# Patient Record
Sex: Female | Born: 1955 | Race: Black or African American | Hispanic: No | Marital: Single | Smoking: Former smoker
Health system: Southern US, Community
[De-identification: ages and names within clinical notes are randomized; demographics above are authoritative.]

## PROBLEM LIST (undated history)

## (undated) DIAGNOSIS — H269 Unspecified cataract: Secondary | ICD-10-CM

## (undated) DIAGNOSIS — Z860100 Personal history of colon polyps, unspecified: Secondary | ICD-10-CM

## (undated) DIAGNOSIS — M1990 Unspecified osteoarthritis, unspecified site: Secondary | ICD-10-CM

## (undated) DIAGNOSIS — E785 Hyperlipidemia, unspecified: Secondary | ICD-10-CM

## (undated) DIAGNOSIS — Z800 Family history of malignant neoplasm of digestive organs: Secondary | ICD-10-CM

## (undated) DIAGNOSIS — J45909 Unspecified asthma, uncomplicated: Secondary | ICD-10-CM

## (undated) DIAGNOSIS — T7840XA Allergy, unspecified, initial encounter: Secondary | ICD-10-CM

## (undated) DIAGNOSIS — R011 Cardiac murmur, unspecified: Secondary | ICD-10-CM

## (undated) DIAGNOSIS — D649 Anemia, unspecified: Secondary | ICD-10-CM

## (undated) DIAGNOSIS — K5790 Diverticulosis of intestine, part unspecified, without perforation or abscess without bleeding: Secondary | ICD-10-CM

## (undated) HISTORY — PX: UPPER GASTROINTESTINAL ENDOSCOPY: SHX188

## (undated) HISTORY — PX: TONSILLECTOMY: SUR1361

## (undated) HISTORY — DX: Unspecified cataract: H26.9

## (undated) HISTORY — DX: Unspecified asthma, uncomplicated: J45.909

## (undated) HISTORY — DX: Hyperlipidemia, unspecified: E78.5

## (undated) HISTORY — PX: TOTAL ABDOMINAL HYSTERECTOMY: SHX209

## (undated) HISTORY — DX: Allergy, unspecified, initial encounter: T78.40XA

## (undated) HISTORY — DX: Personal history of colon polyps, unspecified: Z86.0100

## (undated) HISTORY — DX: Cardiac murmur, unspecified: R01.1

## (undated) HISTORY — PX: COLONOSCOPY W/ POLYPECTOMY: SHX1380

## (undated) HISTORY — DX: Diverticulosis of intestine, part unspecified, without perforation or abscess without bleeding: K57.90

## (undated) HISTORY — DX: Family history of malignant neoplasm of digestive organs: Z80.0

## (undated) HISTORY — DX: Unspecified osteoarthritis, unspecified site: M19.90

## (undated) HISTORY — DX: Personal history of colonic polyps: Z86.010

## (undated) HISTORY — PX: TOOTH EXTRACTION: SUR596

---

## 2002-06-30 DIAGNOSIS — D126 Benign neoplasm of colon, unspecified: Secondary | ICD-10-CM

## 2002-06-30 HISTORY — DX: Benign neoplasm of colon, unspecified: D12.6

## 2007-02-23 ENCOUNTER — Emergency Department (HOSPITAL_COMMUNITY): Admission: EM | Admit: 2007-02-23 | Discharge: 2007-02-23 | Payer: Self-pay | Admitting: Emergency Medicine

## 2019-02-27 DIAGNOSIS — H04123 Dry eye syndrome of bilateral lacrimal glands: Secondary | ICD-10-CM | POA: Diagnosis not present

## 2019-05-20 ENCOUNTER — Encounter: Payer: Self-pay | Admitting: Internal Medicine

## 2019-05-23 ENCOUNTER — Other Ambulatory Visit: Payer: Self-pay | Admitting: Internal Medicine

## 2019-05-23 DIAGNOSIS — Z87891 Personal history of nicotine dependence: Secondary | ICD-10-CM

## 2019-05-23 DIAGNOSIS — Z0000 Encounter for general adult medical examination without abnormal findings: Secondary | ICD-10-CM

## 2019-06-21 ENCOUNTER — Other Ambulatory Visit: Payer: Self-pay

## 2019-06-21 ENCOUNTER — Ambulatory Visit
Admission: RE | Admit: 2019-06-21 | Discharge: 2019-06-21 | Disposition: A | Discharge status: Home / Self Care | Payer: Medicare Other | Source: Ambulatory Visit | Attending: Internal Medicine | Admitting: Internal Medicine

## 2019-06-21 DIAGNOSIS — Z0000 Encounter for general adult medical examination without abnormal findings: Secondary | ICD-10-CM

## 2019-06-21 DIAGNOSIS — Z87891 Personal history of nicotine dependence: Secondary | ICD-10-CM

## 2019-08-26 ENCOUNTER — Encounter: Payer: Self-pay | Admitting: Internal Medicine

## 2019-10-13 ENCOUNTER — Ambulatory Visit (INDEPENDENT_AMBULATORY_CARE_PROVIDER_SITE_OTHER): Payer: Medicare Other | Admitting: Internal Medicine

## 2019-10-13 ENCOUNTER — Encounter: Payer: Self-pay | Admitting: Internal Medicine

## 2019-10-13 VITALS — BP 118/72 | HR 72 | Temp 98.2°F | Ht 62.0 in | Wt 161.5 lb

## 2019-10-13 DIAGNOSIS — Z01818 Encounter for other preprocedural examination: Secondary | ICD-10-CM

## 2019-10-13 DIAGNOSIS — Z860101 Personal history of adenomatous and serrated colon polyps: Secondary | ICD-10-CM | POA: Insufficient documentation

## 2019-10-13 DIAGNOSIS — K5909 Other constipation: Secondary | ICD-10-CM | POA: Diagnosis not present

## 2019-10-13 DIAGNOSIS — Z800 Family history of malignant neoplasm of digestive organs: Secondary | ICD-10-CM | POA: Insufficient documentation

## 2019-10-13 DIAGNOSIS — Z86010 Personal history of colonic polyps: Secondary | ICD-10-CM | POA: Diagnosis not present

## 2019-10-13 MED ORDER — NA SULFATE-K SULFATE-MG SULF 17.5-3.13-1.6 GM/177ML PO SOLN: ORAL | 0 refills | Status: DC

## 2019-10-13 NOTE — Progress Notes (Signed)
   Lori Cummings 64 y.o. 03/08/1956 [id number]  Assessment & Plan:   Encounter Diagnoses  Name Primary?  Marland Kitchen Hx of adenomatous polyp of colon Yes  . Chronic constipation   . Family history of colon cancer- sister at 79     Colonoscopy for hx of adenoma + FHx CRCA Functional rectal exam prior to procedure sedation  Agree w/ daily MiraLax  Further plans pending colonoscopy Consider iron as an aggravating factor though constipation clearly chronic   The risks and benefits as well as alternatives of endoscopic procedure(s) have been discussed and reviewed. All questions answered. The patient agrees to proceed.  [id number], Liane Comber, DO    Subjective:   Chief Complaint: chronic constipation and hx colon polyp  HPI Chronic constipation "forever" Gets urge some but difficult to defecate. No urination difficulty Mg capsule qd helps but "I have to take something"  Ct Mg q week to 2 weeks  Incomplete defecation sensation much of the time  Has tried fiber no help  Dr. Francesco Sor recommended MiraLax qd has not started  Sisiter had CRCA at 29 GI ROS negative o/wise  Hgb 13.6 05/2019 Ca 9.4 TSH NL at 1 Allergies  Allergen Reactions  . Amoxicillin Itching and Rash  . Penicillins Itching and Rash   Current Meds  Medication Sig  . albuterol (VENTOLIN HFA) 108 (90 Base) MCG/ACT inhaler Inhale 2 puffs into the lungs as needed.  . Ascorbic Acid (VITAMIN C PO) Take 1 tablet by mouth daily.  . Cholecalciferol (VITAMIN D3) 125 MCG (5000 UT) CAPS Take 1 capsule by mouth daily.  . Cyanocobalamin (VITAMIN B-12) 2500 MCG SUBL Place 1 tablet under the tongue daily.  Marland Kitchen ELDERBERRY PO Take 1 tablet by mouth daily.  . ferrous sulfate 325 (65 FE) MG EC tablet Take 325 mg by mouth daily.  Marland Kitchen MAGNESIUM PO Take 1 tablet by mouth daily.  . Multiple Vitamins-Minerals (ZINC PO) Take 1 tablet by mouth daily.   Past Medical History:  Diagnosis Date  . Allergy   . Diverticulosis   . HLD  (hyperlipidemia)   . Tubular adenoma of colon 2004   Past Surgical History:  Procedure Laterality Date  . COLONOSCOPY W/ POLYPECTOMY    . TONSILLECTOMY    . TOTAL ABDOMINAL HYSTERECTOMY     Social History   Social History Narrative   Divorced   Part-time hairdresser   Former smoker   occ etOH, no drugs (remote in youth)   family history includes Breast cancer in her maternal aunt; Colon cancer (age of onset: 65) in her sister; Colon polyps in her father; Dementia in her mother and paternal aunt; Heart disease in her brother and mother; Hypertension in her father, mother, and sister; Kidney cancer in her maternal aunt.   Review of Systems As per HPI Some dyspnea, night sweats, allergies, back pain  Objective:   Physical Exam BP 118/72 (BP Location: Left Arm, Patient Position: Sitting, Cuff Size: Normal)   Pulse 72   Temp 98.2 F (36.8 C)   Ht 5\' 2"  (1.575 m) Comment: height measured without shoes  Wt 161 lb 8 oz (73.3 kg)   BMI 29.54 kg/m  NAD Eyes anicteric Lungs cta Cor NL abd soft NT no masses and BS + Rectal deferred to colonoscopy Alert and oriented x 3

## 2019-10-13 NOTE — Patient Instructions (Addendum)
If you are age 66 or older, your body mass index should be between 23-30. Your Body mass index is 29.54 kg/m. If this is out of the aforementioned range listed, please consider follow up with your Primary Care Provider.  If you are age 53 or younger, your body mass index should be between 19-25. Your Body mass index is 29.54 kg/m. If this is out of the aformentioned range listed, please consider follow up with your Primary Care Provider.   You have been scheduled for a colonoscopy. Please follow written instructions given to you at your visit today.  Please pick up your prep supplies at the pharmacy within the next 1-3 days. If you use inhalers (even only as needed), please bring them with you on the day of your procedure. Your physician has requested that you go to www.startemmi.com and enter the access code given to you at your visit today. This web site gives a general overview about your procedure. However, you should still follow specific instructions given to you by our office regarding your preparation for the procedure.  We have sent the following medications to your pharmacy for you to pick up at your convenience: [place].  Start Miralax - 1 capful daily. (This is over-the-counter)  Thank you for choosing me and [facility].   Silvano Rusk, MD

## 2019-11-16 ENCOUNTER — Other Ambulatory Visit: Payer: Self-pay | Admitting: Internal Medicine

## 2019-11-16 ENCOUNTER — Ambulatory Visit (INDEPENDENT_AMBULATORY_CARE_PROVIDER_SITE_OTHER): Payer: Medicare Other

## 2019-11-16 DIAGNOSIS — Z1159 Encounter for screening for other viral diseases: Secondary | ICD-10-CM

## 2019-11-17 ENCOUNTER — Other Ambulatory Visit: Payer: Self-pay | Admitting: Internal Medicine

## 2019-11-17 DIAGNOSIS — Z1231 Encounter for screening mammogram for malignant neoplasm of breast: Secondary | ICD-10-CM

## 2019-11-17 DIAGNOSIS — R5381 Other malaise: Secondary | ICD-10-CM

## 2019-11-17 LAB — SARS CORONAVIRUS 2 (TAT 6-24 HRS): SARS Coronavirus 2: NEGATIVE

## 2019-11-18 ENCOUNTER — Encounter: Payer: Self-pay | Admitting: Internal Medicine

## 2019-11-18 ENCOUNTER — Other Ambulatory Visit: Payer: Self-pay

## 2019-11-18 ENCOUNTER — Ambulatory Visit (AMBULATORY_SURGERY_CENTER): Payer: Medicare Other | Admitting: Internal Medicine

## 2019-11-18 VITALS — BP 122/86 | HR 62 | Temp 97.3°F | Resp 15 | Ht 62.0 in | Wt 161.0 lb

## 2019-11-18 DIAGNOSIS — Z800 Family history of malignant neoplasm of digestive organs: Secondary | ICD-10-CM | POA: Diagnosis not present

## 2019-11-18 DIAGNOSIS — Z86010 Personal history of colonic polyps: Secondary | ICD-10-CM

## 2019-11-18 DIAGNOSIS — D123 Benign neoplasm of transverse colon: Secondary | ICD-10-CM

## 2019-11-18 MED ORDER — SODIUM CHLORIDE 0.9 % IV SOLN: 500.0000 mL | Freq: Once | INTRAVENOUS | Status: DC

## 2019-11-18 NOTE — Progress Notes (Signed)
Vitals-DT  Pt's states no medical or surgical changes since previsit or office visit.  CRNA aware of the patient having 1145 cup of water.

## 2019-11-18 NOTE — Patient Instructions (Addendum)
I found and removed one polyp that looks benign.  You also have a condition called diverticulosis - common and not usually a problem. Please read the handout provided.  The anus and rectum seem to function ok.  Please try taking 2 doses of MiraLax each day and purchase Senna laxative - take 1-2 every other night - see if this does not help your constipation more. If that does not work well enough return to see me.  I appreciate the opportunity to care for you. Gatha Mayer, MD, [facility]  Handouts provided on polyps and diverticulosis.   No aspirin, ibuprofen, naproxen, or other non-steriodal anti-inflammatory drugs for 2 weeks after polyp removal.   YOU HAD AN ENDOSCOPIC PROCEDURE TODAY AT [facility]:   Refer to the procedure report that was given to you for any specific questions about what was found during the examination.  If the procedure report does not answer your questions, please call your gastroenterologist to clarify.  If you requested that your care partner not be given the details of your procedure findings, then the procedure report has been included in a sealed envelope for you to review at your convenience later.  YOU SHOULD EXPECT: Some feelings of bloating in the abdomen. Passage of more gas than usual.  Walking can help get rid of the air that was put into your GI tract during the procedure and reduce the bloating. If you had a lower endoscopy (such as a colonoscopy or flexible sigmoidoscopy) you may notice spotting of blood in your stool or on the toilet paper. If you underwent a bowel prep for your procedure, you may not have a normal bowel movement for a few days.  Please Note:  You might notice some irritation and congestion in your nose or some drainage.  This is from the oxygen used during your procedure.  There is no need for concern and it should clear up in a day or so.  SYMPTOMS TO REPORT IMMEDIATELY:   Following lower endoscopy (colonoscopy or  flexible sigmoidoscopy):  Excessive amounts of blood in the stool  Significant tenderness or worsening of abdominal pains  Swelling of the abdomen that is new, acute  Fever of 100F or higher  For urgent or emergent issues, a gastroenterologist can be reached at any hour by calling [phone]. Do not use MyChart messaging for urgent concerns.    DIET:  We do recommend a small meal at first, but then you may proceed to your regular diet.  Drink plenty of fluids but you should avoid alcoholic beverages for 24 hours.  ACTIVITY:  You should plan to take it easy for the rest of today and you should NOT DRIVE or use heavy machinery until tomorrow (because of the sedation medicines used during the test).    FOLLOW UP: Our staff will call the number listed on your records 48-72 hours following your procedure to check on you and address any questions or concerns that you may have regarding the information given to you following your procedure. If we do not reach you, we will leave a message.  We will attempt to reach you two times.  During this call, we will ask if you have developed any symptoms of COVID 19. If you develop any symptoms (ie: fever, flu-like symptoms, shortness of breath, cough etc.) before then, please call [phone].  If you test positive for Covid 19 in the 2 weeks post procedure, please call and report this information to [place].  If any biopsies were taken you will be contacted by phone or by letter within the next 1-3 weeks.  Please call us at [phone] if you have not heard about the biopsies in 3 weeks.    SIGNATURES/CONFIDENTIALITY: You and/or your care partner have signed paperwork which will be entered into your electronic medical record.  These signatures attest to the fact that that the information above on your After Visit Summary has been reviewed and is understood.  Full responsibility of the confidentiality of this discharge information lies with you and/or  your care-partner.

## 2019-11-18 NOTE — Progress Notes (Signed)
Called to room to assist during endoscopic procedure.  Patient ID and intended procedure confirmed with present staff. Received instructions for my participation in the procedure from the performing physician.  

## 2019-11-18 NOTE — Op Note (Addendum)
[facility] Patient Name: Lori Cummings Procedure Date: 11/18/2019 1:55 PM MRN: [id number] Endoscopist: Gatha Mayer , MD Age: 64 Referring MD:  Date of Birth: 30-Mar-1956 Gender: Female Account #: [id number] Procedure:                Colonoscopy Indications:              Surveillance: Personal history of adenomatous                            polyps on last colonoscopy > 5 years ago Medicines:                Propofol per Anesthesia, Monitored Anesthesia Care Procedure:                Pre-Anesthesia Assessment:                           - Prior to the procedure, a History and Physical                            was performed, and patient medications and                            allergies were reviewed. The patient's tolerance of                            previous anesthesia was also reviewed. The risks                            and benefits of the procedure and the sedation                            options and risks were discussed with the patient.                            All questions were answered, and informed consent                            was obtained. Prior Anticoagulants: The patient has                            taken no previous anticoagulant or antiplatelet                            agents. ASA Grade Assessment: II - A patient with                            mild systemic disease. After reviewing the risks                            and benefits, the patient was deemed in                            satisfactory condition to undergo the procedure.  After obtaining informed consent, the colonoscope                            was passed under direct vision. Throughout the                            procedure, the patient's blood pressure, pulse, and                            oxygen saturations were monitored continuously. The                            Colonoscope was introduced through the anus and   advanced to the the cecum, identified by                            appendiceal orifice and ileocecal valve. The                            colonoscopy was performed without difficulty. The                            patient tolerated the procedure well. The quality                            of the bowel preparation was good. The ileocecal                            valve, appendiceal orifice, and rectum were                            photographed. The bowel preparation used was SUPREP                            via split dose instruction. Scope In: 2:10:32 PM Scope Out: 2:27:20 PM Scope Withdrawal Time: 0 hours 12 minutes 33 seconds  Total Procedure Duration: 0 hours 16 minutes 48 seconds  Findings:                 The perianal and digital rectal examinations were                            normal.                           A 8 mm polyp was found in the transverse colon. The                            polyp was semi-pedunculated. The polyp was removed                            with a hot snare. Resection and retrieval were                            complete. Verification of patient  identification                            for the specimen was done. Estimated blood loss:                            none.                           Multiple small and large-mouthed diverticula were                            found in the sigmoid colon, descending colon,                            transverse colon and ascending colon.                           The exam was otherwise without abnormality on                            direct and retroflexion views. Complications:            No immediate complications. Estimated Blood Loss:     Estimated blood loss: none. Impression:               - One 8 mm polyp in the transverse colon, removed                            with a hot snare. Resected and retrieved.                           - Diverticulosis in the sigmoid colon, in the                             descending colon, in the transverse colon and in                            the ascending colon.                           - The examination was otherwise normal on direct                            and retroflexion views.                           - Personal history of colonic polyps.                           - FHx CRCA sister at 55 Recommendation:           - Patient has a contact number available for                            emergencies. The signs and symptoms of potential  delayed complications were discussed with the                            patient. Return to normal activities tomorrow.                            Written discharge instructions were provided to the                            patient.                           - Resume previous diet.                           - Continue present medications.                           - No aspirin, ibuprofen, naproxen, or other                            non-steroidal anti-inflammatory drugs for 2 weeks                            after polyp removal.                           - Repeat colonoscopy is recommended for                            surveillance. The colonoscopy date will be                            determined after pathology results from today's                            exam become available for review.                           - Try 2 doses MiraLax each day and Senn lax 1-2                            every other night to treat constipation                           - cc Sueanne Margarita, DO Gatha Mayer, MD 11/18/2019 2:38:46 PM This report has been signed electronically.

## 2019-11-18 NOTE — Progress Notes (Signed)
A/ox3, pleased with MAC, report to RN 

## 2019-11-22 ENCOUNTER — Telehealth: Payer: Self-pay | Admitting: *Deleted

## 2019-11-22 NOTE — Telephone Encounter (Signed)
1. Have you developed a fever since your procedure? no  2.   Have you had an respiratory symptoms (SOB or cough) since your procedure? no  3.   Have you tested positive for COVID 19 since your procedure no  4.   Have you had any family members/close contacts diagnosed with the COVID 19 since your procedure?  no   If yes to any of these questions please route to Joylene John, RN and Erenest Rasher, RN Follow up Call-  Call back number 11/18/2019  Post procedure Call Back phone  # [phone]  Permission to leave phone message Yes  Some recent data might be hidden     Patient questions:  Do you have a fever, pain , or abdominal swelling? No. Pain Score  0 *  Have you tolerated food without any problems? Yes.    Have you been able to return to your normal activities? Yes.    Do you have any questions about your discharge instructions: Diet   No. Medications  No. Follow up visit  No.  Do you have questions or concerns about your Care? No.  Actions: * If pain score is 4 or above: No action needed, pain <4.

## 2019-11-24 ENCOUNTER — Encounter: Payer: Self-pay | Admitting: Internal Medicine

## 2019-11-24 ENCOUNTER — Other Ambulatory Visit: Payer: Self-pay | Admitting: Internal Medicine

## 2019-11-24 DIAGNOSIS — E2839 Other primary ovarian failure: Secondary | ICD-10-CM

## 2020-02-03 ENCOUNTER — Ambulatory Visit
Admission: RE | Admit: 2020-02-03 | Discharge: 2020-02-03 | Disposition: A | Discharge status: Home / Self Care | Payer: Medicare Other | Source: Ambulatory Visit | Attending: Internal Medicine | Admitting: Internal Medicine

## 2020-02-03 ENCOUNTER — Other Ambulatory Visit: Payer: Self-pay

## 2020-02-03 DIAGNOSIS — Z1231 Encounter for screening mammogram for malignant neoplasm of breast: Secondary | ICD-10-CM

## 2020-02-03 DIAGNOSIS — E2839 Other primary ovarian failure: Secondary | ICD-10-CM

## 2020-08-10 ENCOUNTER — Other Ambulatory Visit: Payer: Self-pay

## 2020-08-10 ENCOUNTER — Encounter: Payer: Self-pay | Admitting: Podiatry

## 2020-08-10 ENCOUNTER — Ambulatory Visit (INDEPENDENT_AMBULATORY_CARE_PROVIDER_SITE_OTHER): Payer: Medicare Other

## 2020-08-10 ENCOUNTER — Ambulatory Visit (INDEPENDENT_AMBULATORY_CARE_PROVIDER_SITE_OTHER): Payer: Medicare Other | Admitting: Podiatry

## 2020-08-10 DIAGNOSIS — T148XXA Other injury of unspecified body region, initial encounter: Secondary | ICD-10-CM

## 2020-08-10 DIAGNOSIS — M2142 Flat foot [pes planus] (acquired), left foot: Secondary | ICD-10-CM

## 2020-08-10 DIAGNOSIS — M2141 Flat foot [pes planus] (acquired), right foot: Secondary | ICD-10-CM

## 2020-08-10 DIAGNOSIS — M76821 Posterior tibial tendinitis, right leg: Secondary | ICD-10-CM

## 2020-08-10 MED ORDER — TRIAMCINOLONE ACETONIDE 10 MG/ML IJ SUSP
10.0000 mg | Freq: Once | INTRAMUSCULAR | Status: AC
  Administered 2020-08-10: 10 mg

## 2020-08-10 NOTE — Patient Instructions (Signed)

## 2020-08-31 ENCOUNTER — Other Ambulatory Visit: Payer: Self-pay

## 2020-08-31 ENCOUNTER — Encounter: Payer: Self-pay | Admitting: Podiatry

## 2020-08-31 ENCOUNTER — Ambulatory Visit (INDEPENDENT_AMBULATORY_CARE_PROVIDER_SITE_OTHER): Payer: Medicare Other | Admitting: Podiatry

## 2020-08-31 DIAGNOSIS — T148XXA Other injury of unspecified body region, initial encounter: Secondary | ICD-10-CM

## 2020-08-31 DIAGNOSIS — M76822 Posterior tibial tendinitis, left leg: Secondary | ICD-10-CM | POA: Diagnosis not present

## 2020-08-31 DIAGNOSIS — M76821 Posterior tibial tendinitis, right leg: Secondary | ICD-10-CM | POA: Diagnosis not present

## 2020-08-31 NOTE — Progress Notes (Signed)
Subjective:   Patient ID: Lori Cummings, female   DOB: 65 y.o.   MRN: [id number]   HPI Patient states I am improving but has been wearing the boot full-time for the last 3 weeks.  States it is some better but still notes discomfort and knows that she needs long-term help with this   ROS      Objective:  Physical Exam  Neurovascular status intact with posterior tibial dysfunction right with significant reduction of the swelling associated with it but flatfoot deformity     Assessment:  Posterior tibial tendinitis right with possible dysfunction possible tear of the tendon with inflammatory changes and flatfoot     Plan:  H&P reviewed condition at great length.  At this point I did dispense a fascial brace to wear temporarily I casted for orthotics and I want her still to wear the boot for the next several weeks but slowly reduce over that period of time.  Patient will be seen back for [place] to recheck is encouraged to call questions concerns which may arise until orthotics return

## 2020-08-31 NOTE — Progress Notes (Signed)
Subjective:   Patient ID: Lori Cummings, female   DOB: 65 y.o.   MRN: [id number]   HPI Patient presents stating she has severe flatfoot deformity that occurred right and she has had a number of months of pain with walking.  Does not remember specific injury states it is gradually gotten worse over that time.  Patient does not smoke likes to be active   Review of Systems  All other systems reviewed and are negative.       Objective:  Physical Exam Vitals and nursing note reviewed.  Constitutional:      Appearance: She is well-developed and well-nourished.  Cardiovascular:     Pulses: Intact distal pulses.  Pulmonary:     Effort: Pulmonary effort is normal.  Musculoskeletal:        General: Normal range of motion.  Skin:    General: Skin is warm.  Neurological:     Mental Status: She is alert.     Neurovascular status intact muscle strength adequate range of motion adequate.  Patient is found to have significant flatfoot deformity right with inflammation posterior tibial tendon as it comes under the medial malleolus and signs that there may be some abnormal structure or damage to the tendon itself.  Patient is found to have good digital perfusion well oriented x3     Assessment:  Acute posterior tibial tendinitis right     Plan:  H&P reviewed conditions explained condition.  I recommended trying to treat this conservatively with possibility there may be a tear which may have to be addressed long-term.  Today I went ahead and I did a sheath injection 3 mg Dexasone Kenalog 5 mg Xylocaine applied air fracture walker to completely immobilize discussed possible long-term orthotics and MRI if symptoms persist  X-rays indicate significant flatfoot deformity right over left no indication fracture advanced arthritis or subtalar joint coalition

## 2020-09-28 ENCOUNTER — Ambulatory Visit (INDEPENDENT_AMBULATORY_CARE_PROVIDER_SITE_OTHER): Payer: Medicare Other | Admitting: Podiatry

## 2020-09-28 ENCOUNTER — Other Ambulatory Visit: Payer: Self-pay

## 2020-09-28 DIAGNOSIS — M2141 Flat foot [pes planus] (acquired), right foot: Secondary | ICD-10-CM

## 2020-09-28 DIAGNOSIS — M76821 Posterior tibial tendinitis, right leg: Secondary | ICD-10-CM

## 2020-09-28 DIAGNOSIS — M2142 Flat foot [pes planus] (acquired), left foot: Secondary | ICD-10-CM

## 2020-09-28 NOTE — Patient Instructions (Signed)

## 2020-10-02 NOTE — Progress Notes (Signed)
Patient presents 09-28-2020 for orthotic pick up. Patient voices no new complaints.  Orthotics were fitted to patient's feet. No discomfort and no rubbing. Patient satisfied with the orthotics.  Orthotics were dispensed to patient with instructions for break in wear and to call the office with any concerns or questions.

## 2021-01-08 ENCOUNTER — Other Ambulatory Visit: Payer: Self-pay | Admitting: Internal Medicine

## 2021-01-08 DIAGNOSIS — Z1231 Encounter for screening mammogram for malignant neoplasm of breast: Secondary | ICD-10-CM

## 2021-03-01 ENCOUNTER — Ambulatory Visit
Admission: RE | Admit: 2021-03-01 | Discharge: 2021-03-01 | Disposition: A | Discharge status: Home / Self Care | Payer: Medicare Other | Source: Ambulatory Visit | Attending: Internal Medicine | Admitting: Internal Medicine

## 2021-03-01 ENCOUNTER — Other Ambulatory Visit: Payer: Self-pay

## 2021-03-01 DIAGNOSIS — Z1231 Encounter for screening mammogram for malignant neoplasm of breast: Secondary | ICD-10-CM

## 2021-05-30 ENCOUNTER — Ambulatory Visit (INDEPENDENT_AMBULATORY_CARE_PROVIDER_SITE_OTHER): Payer: Medicare Other | Admitting: Internal Medicine

## 2021-05-30 ENCOUNTER — Encounter: Payer: Self-pay | Admitting: Internal Medicine

## 2021-05-30 VITALS — BP 119/64 | HR 78 | Ht 62.0 in | Wt 175.0 lb

## 2021-05-30 DIAGNOSIS — R1012 Left upper quadrant pain: Secondary | ICD-10-CM

## 2021-05-30 DIAGNOSIS — R1013 Epigastric pain: Secondary | ICD-10-CM

## 2021-05-30 NOTE — Patient Instructions (Signed)
You have been scheduled for an endoscopy. Please follow written instructions given to you at your visit today. If you use inhalers (even only as needed), please bring them with you on the day of your procedure.  Please stay on a liquid diet until your procedure.  We will request your lab results from [facility].   I appreciate the opportunity to care for you. Silvano Rusk, MD, [facility]

## 2021-05-30 NOTE — Progress Notes (Addendum)
Lori Cummings 65 y.o. [birth year] [id number]  Assessment & Plan:   Encounter Diagnoses  Name Primary?   Abdominal pain, epigastric Yes   LUQ pain    Needs EGD to evaluate the symptoms.  Dr. Candis Schatz has earlier availability than me so the procedure will be performed on December 5.  The risks and benefits as well as alternatives of endoscopic procedure(s) have been discussed and reviewed. All questions answered. The patient agrees to proceed.   Continue a liquid diet until then  We will request the labs she had drawn earlier this week at primary care  As far as the cause of this question peptic ulcer disease, upper GI tract malignancy is in the differential.  She has been gaining weight however so that goes against.  She reports that she is disturbed by the death of her brother-in-law and nephew so question if this is a manifestation of situational stress.  However that is a diagnosis of exclusion at this point.  Depending upon EGD results, consider cross-sectional imaging with CT scanning.  CC: Sueanne Margarita, DO Dr. Dustin Flock  05/27/2022 labs normal CMP T normal CBC hemoglobin 14 hematocrit 41 platelets 319 white count 4.5 lipase normal at 18 and was normal at 30 Subjective:   Chief Complaint: Left upper quadrant pain and epigastric pain  HPI The patient is a 65 year old woman known to me from prior colonoscopy last done in 2021 with an 8 mm adenoma she also has a family history of colon cancer.  Chronic constipation is treated adequately with MiraLAX.  She reports since Thanksgiving having very severe rapid onset epigastric and left upper quadrant pain after eating.  She is not having dysphagia that I can tell.  After she eats the pain is so intense she induces vomiting.  She has been on a liquid diet and is tolerating that.  She went to primary care on November 28 and was referred here.  I do not have those records.  Laboratory testing was performed and we do not have  those results.  She was started on pantoprazole 40 mg twice daily.  She has restricted her diet still so we do not know if she is able to eat yet again.  She does recall having isolated episodes like this over the last year or so but not as severe.   Brother-in-law and nephew died in past 2 mos - source of stress for her.  No other stressors claimed are identified.  No change in medications.   Wt Readings from Last 3 Encounters:  05/30/21 175 lb (79.4 kg)  11/18/19 161 lb (73 kg)  10/13/19 161 lb 8 oz (73.3 kg)    Allergies  Allergen Reactions   Amoxicillin Itching and Rash   Penicillins Itching and Rash   Current Meds  Medication Sig   albuterol (VENTOLIN HFA) 108 (90 Base) MCG/ACT inhaler Inhale 2 puffs into the lungs as needed.   Ascorbic Acid (VITAMIN C PO) Take 1 tablet by mouth daily.   Cholecalciferol (VITAMIN D3) 125 MCG (5000 UT) CAPS Take 1 capsule by mouth daily.   Cyanocobalamin (VITAMIN B-12) 2500 MCG SUBL Place 1 tablet under the tongue daily.   ELDERBERRY PO Take 1 tablet by mouth daily.   ferrous sulfate 325 (65 FE) MG EC tablet Take 325 mg by mouth daily.   MAGNESIUM PO Take 1 tablet by mouth daily.   Multiple Vitamins-Minerals (ZINC PO) Take 1 tablet by mouth daily.   pantoprazole (PROTONIX) 40 MG tablet  Take 40 mg by mouth 2 (two) times daily before a meal.   Past Medical History:  Diagnosis Date   Allergy    Diverticulosis    Family history of colon cancer    sister   History of colon polyps    HLD (hyperlipidemia)    Past Surgical History:  Procedure Laterality Date   COLONOSCOPY W/ POLYPECTOMY     TONSILLECTOMY     TOTAL ABDOMINAL HYSTERECTOMY     Social History   Social History Narrative   Divorced   Part-time hairdresser   Former smoker   occ etOH, no drugs (remote in youth)   family history includes Breast cancer in her maternal aunt; Colon cancer (age of onset: 96) in her sister; Colon polyps in her father; Dementia in her mother and  paternal aunt; Heart disease in her brother and mother; Hypertension in her father, mother, and sister; Kidney cancer in her maternal aunt.   Review of Systems As per HPI  Objective:   Physical Exam BP 119/64   Pulse 78   Ht 5\' 2"  (1.575 m)   Wt 175 lb (79.4 kg)   SpO2 97%   BMI 32.01 kg/m  Obese bw NAD Anicteric Lungs cta Neck supple, no LA Cor NL S1S2 no rmg Abd obese and soft, ? Sl LUQ tenderness no masses Alert and oriented x 3

## 2021-06-03 ENCOUNTER — Other Ambulatory Visit: Payer: Self-pay

## 2021-06-03 ENCOUNTER — Telehealth: Payer: Self-pay

## 2021-06-03 ENCOUNTER — Encounter: Payer: Self-pay | Admitting: Gastroenterology

## 2021-06-03 ENCOUNTER — Ambulatory Visit (AMBULATORY_SURGERY_CENTER): Payer: Medicare Other | Admitting: Gastroenterology

## 2021-06-03 VITALS — BP 117/78 | HR 69 | Temp 96.8°F | Resp 16 | Ht 62.0 in | Wt 175.0 lb

## 2021-06-03 DIAGNOSIS — R1012 Left upper quadrant pain: Secondary | ICD-10-CM

## 2021-06-03 DIAGNOSIS — R112 Nausea with vomiting, unspecified: Secondary | ICD-10-CM | POA: Diagnosis not present

## 2021-06-03 DIAGNOSIS — K222 Esophageal obstruction: Secondary | ICD-10-CM | POA: Diagnosis not present

## 2021-06-03 DIAGNOSIS — R1013 Epigastric pain: Secondary | ICD-10-CM

## 2021-06-03 DIAGNOSIS — K449 Diaphragmatic hernia without obstruction or gangrene: Secondary | ICD-10-CM

## 2021-06-03 MED ORDER — SODIUM CHLORIDE 0.9 % IV SOLN: 500.0000 mL | Freq: Once | INTRAVENOUS | Status: DC

## 2021-06-03 NOTE — Telephone Encounter (Signed)
Order in for CT Of A/P, radiology scheduling to contact pt regarding scheduling appt. Referral faxed to [facility] for Dr. Redmond Pulling appt, requested Urgent appt.

## 2021-06-03 NOTE — Patient Instructions (Signed)
Information on hiatal hernia given to you today.  Resume previous diet and we recommend a liquid diet (milkshakes and protein shakes like Ensure and Boost) to improve symptoms.  Perform a CT scan of chest with contrast and abdomen with contrast at appointment to be scheduled.   YOU HAD AN ENDOSCOPIC PROCEDURE TODAY AT [facility]:   Refer to the procedure report that was given to you for any specific questions about what was found during the examination.  If the procedure report does not answer your questions, please call your gastroenterologist to clarify.  If you requested that your care partner not be given the details of your procedure findings, then the procedure report has been included in a sealed envelope for you to review at your convenience later.  YOU SHOULD EXPECT: Some feelings of bloating in the abdomen. Passage of more gas than usual.  Walking can help get rid of the air that was put into your GI tract during the procedure and reduce the bloating. If you had a lower endoscopy (such as a colonoscopy or flexible sigmoidoscopy) you may notice spotting of blood in your stool or on the toilet paper. If you underwent a bowel prep for your procedure, you may not have a normal bowel movement for a few days.  Please Note:  You might notice some irritation and congestion in your nose or some drainage.  This is from the oxygen used during your procedure.  There is no need for concern and it should clear up in a day or so.  SYMPTOMS TO REPORT IMMEDIATELY:  Following upper endoscopy (EGD)  Vomiting of blood or coffee ground material  New chest pain or pain under the shoulder blades  Painful or persistently difficult swallowing  New shortness of breath  Fever of 100F or higher  Black, tarry-looking stools  For urgent or emergent issues, a gastroenterologist can be reached at any hour by calling [phone]. Do not use MyChart messaging for urgent concerns.    DIET:   We do recommend a small meal at first, but then you may proceed to your regular diet.  Drink plenty of fluids but you should avoid alcoholic beverages for 24 hours.  ACTIVITY:  You should plan to take it easy for the rest of today and you should NOT DRIVE or use heavy machinery until tomorrow (because of the sedation medicines used during the test).    FOLLOW UP: Our staff will call the number listed on your records 48-72 hours following your procedure to check on you and address any questions or concerns that you may have regarding the information given to you following your procedure. If we do not reach you, we will leave a message.  We will attempt to reach you two times.  During this call, we will ask if you have developed any symptoms of COVID 19. If you develop any symptoms (ie: fever, flu-like symptoms, shortness of breath, cough etc.) before then, please call [phone].  If you test positive for Covid 19 in the 2 weeks post procedure, please call and report this information to [place].    If any biopsies were taken you will be contacted by phone or by letter within the next 1-3 weeks.  Please call us at [phone] if you have not heard about the biopsies in 3 weeks.    SIGNATURES/CONFIDENTIALITY: You and/or your care partner have signed paperwork which will be entered into your electronic medical record.  These signatures attest to  the fact that that the information above on your After Visit Summary has been reviewed and is understood.  Full responsibility of the confidentiality of this discharge information lies with you and/or your care-partner.

## 2021-06-03 NOTE — Telephone Encounter (Signed)
-----   Message from Daryel November, MD sent at 06/03/2021  9:20 AM EST ----- Good morning Lori Cummings,  Can you please place an expedited (ASAP) referral to surgery (preferrably Dr. Redmond Pulling) for this patient?  She has a large paraesophageal hernia and is only able to tolerate small amounts of liquids.  I would also like to get a CT chest/abdomen. Thanks,  Dr. Loletha Grayer

## 2021-06-03 NOTE — Progress Notes (Signed)
VS-SM 

## 2021-06-03 NOTE — Op Note (Signed)
[facility] Patient Name: Lori Cummings Procedure Date: 06/03/2021 7:12 AM MRN: [id number] Endoscopist: Nicki Reaper E. Candis Schatz , MD Age: 65 Referring MD:  Date of Birth: 1956-04-18 Gender: Female Account #: [id number] Procedure:                Upper GI endoscopy Indications:              Abdominal pain in the left upper quadrant, Nausea                            with vomiting Medicines:                Monitored Anesthesia Care Procedure:                Pre-Anesthesia Assessment:                           - Prior to the procedure, a History and Physical                            was performed, and patient medications and                            allergies were reviewed. The patient's tolerance of                            previous anesthesia was also reviewed. The risks                            and benefits of the procedure and the sedation                            options and risks were discussed with the patient.                            All questions were answered, and informed consent                            was obtained. Prior Anticoagulants: The patient has                            taken no previous anticoagulant or antiplatelet                            agents except for aspirin. ASA Grade Assessment: II                            - A patient with mild systemic disease. After                            reviewing the risks and benefits, the patient was                            deemed in satisfactory condition to undergo the  procedure.                           After obtaining informed consent, the endoscope was                            passed under direct vision. Throughout the                            procedure, the patient's blood pressure, pulse, and                            oxygen saturations were monitored continuously. The                            Endoscope was introduced through the mouth, and                             advanced to the second part of duodenum. The upper                            GI endoscopy was somewhat difficult due to abnormal                            anatomy. Successful completion of the procedure was                            aided by changing the patient to a supine position                            and using manual pressure. The patient tolerated                            the procedure well. Scope In: Scope Out: Findings:                 One benign-appearing, intrinsic mild                            (non-circumferential scarring) stenosis was found.                            This stenosis measured 1.2 cm (inner diameter) x                            less than one cm (in length).                           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                            The proximal extent of the gastric folds (end of                            tubular esophagus) was 30  cm from the incisors. The                            hiatal narrowing was 40 cm from the incisors. The                            Z-line was 30 cm from the incisors.                           The exam of the stomach was otherwise normal.                           The examined duodenum was normal.                           The examined portions of the nasopharynx,                            oropharynx and larynx were normal. Complications:            No immediate complications. Estimated Blood Loss:     Estimated blood loss was minimal. Impression:               - Benign-appearing esophageal stenosis.                           - 10 cm paraesophageal hernia. This is the source                            of the patient's pain and PO intolerance                           - Normal examined duodenum.                           - The examined portions of the nasopharynx,                            oropharynx and larynx were normal.                           - No specimens  collected. Recommendation:           - Patient has a contact number available for                            emergencies. The signs and symptoms of potential                            delayed complications were discussed with the                            patient. Return to normal activities tomorrow.                            Written discharge instructions were provided to the  patient.                           - Resume previous diet. Recommend liquid diet to                            improve symptoms (liberalize milkshakes, protein                            shakes (Boost, Ensure, etc).                           - Perform a CT scan (computed tomography) of chest                            with contrast and abdomen with contrast at                            appointment to be scheduled.                           - Refer to a surgeon at appointment to be scheduled                            to discuss paraesophageal hernia repair. Kazuo Durnil E. Candis Schatz, MD 06/03/2021 8:36:27 AM This report has been signed electronically.

## 2021-06-03 NOTE — Progress Notes (Signed)
To pacu, VSS. Report to [email or web address].tb 

## 2021-06-03 NOTE — Progress Notes (Signed)
History and Physical Interval Note:  06/03/2021 7:48 AM  Lori Cummings  has presented today for endoscopic procedure(s), with the diagnosis of  Encounter Diagnoses  Name Primary?   Abdominal pain, epigastric Yes   LUQ pain   .  The various methods of evaluation and treatment have been discussed with the patient and/or family. After consideration of risks, benefits and other options for treatment, the patient has consented to  the endoscopic procedure(s).   The patient's history has been reviewed, patient examined, no change in status, stable for endoscopic procedure(s).  I have reviewed the patient's chart and labs.  Questions were answered to the patient's satisfaction.    No changes in the patient's symptoms since her clinic visit with Dr. Carlean Purl, Dec 1  Wakisha Alberts E. Candis Schatz, MD [facility]

## 2021-06-05 ENCOUNTER — Telehealth: Payer: Self-pay

## 2021-06-05 NOTE — Telephone Encounter (Signed)
  Follow up Call-  Call back number 06/03/2021 11/18/2019  Post procedure Call Back phone  # [phone]  Permission to leave phone message Yes Yes  Some recent data might be hidden     Patient questions:  Do you have a fever, pain , or abdominal swelling? No. Pain Score  0 *  Have you tolerated food without any problems? Yes.    Have you been able to return to your normal activities? Yes.    Do you have any questions about your discharge instructions: Diet   No. Medications  No. Follow up visit  No.  Do you have questions or concerns about your Care? No.  Actions: * If pain score is 4 or above: No action needed, pain <4.

## 2021-06-27 ENCOUNTER — Other Ambulatory Visit: Payer: Self-pay

## 2021-06-27 ENCOUNTER — Ambulatory Visit (HOSPITAL_COMMUNITY): Payer: Medicare Other

## 2021-06-27 ENCOUNTER — Ambulatory Visit (HOSPITAL_COMMUNITY)
Admission: RE | Admit: 2021-06-27 | Discharge: 2021-06-27 | Disposition: A | Discharge status: Home / Self Care | Payer: Medicare Other | Source: Ambulatory Visit | Attending: Gastroenterology | Admitting: Gastroenterology

## 2021-06-27 DIAGNOSIS — K449 Diaphragmatic hernia without obstruction or gangrene: Secondary | ICD-10-CM | POA: Diagnosis not present

## 2021-06-27 LAB — POCT I-STAT CREATININE: Creatinine, Ser: 0.8 mg/dL (ref 0.44–1.00)

## 2021-06-27 MED ORDER — IOHEXOL 350 MG/ML SOLN
75.0000 mL | Freq: Once | INTRAVENOUS | Status: AC | PRN
  Administered 2021-06-27: 11:00:00 75 mL via INTRAVENOUS

## 2021-06-27 MED ORDER — SODIUM CHLORIDE (PF) 0.9 % IJ SOLN
INTRAMUSCULAR | Status: AC
  Filled 2021-06-27: qty 50

## 2021-07-03 NOTE — Progress Notes (Signed)
Lori Cummings,  I hope your symptoms are doing better than when last we spoke.  Your CT scan confirmed that you have a very large, somewhat complicated paraesophageal hernia.  I know you have met with Dr. Redmond Pulling and did not wish to proceed with surgery, which is certainly reasonable.   I would defer to Dr. Redmond Pulling on this, but given the size of your hernia, I think there will be some risk of something called 'gastric volvulus' which can be a serious complication of a large hernia (it's where the stomach twists on itself) and could be prevented by surgery.  I think it is likely you will continue to have symptoms from this hernia throughout your life.  We will send him the CT results to make sure he is aware, and to see if this changes any of his recommendations. I'm including Dr. Carlean Purl for his input and awareness.  Regards,  Dr. Candis Schatz

## 2021-07-10 ENCOUNTER — Other Ambulatory Visit: Payer: Self-pay | Admitting: General Surgery

## 2021-07-10 DIAGNOSIS — E079 Disorder of thyroid, unspecified: Secondary | ICD-10-CM

## 2021-07-17 ENCOUNTER — Other Ambulatory Visit: Payer: Self-pay | Admitting: Internal Medicine

## 2021-07-17 DIAGNOSIS — E079 Disorder of thyroid, unspecified: Secondary | ICD-10-CM

## 2021-07-22 ENCOUNTER — Ambulatory Visit
Admission: RE | Admit: 2021-07-22 | Discharge: 2021-07-22 | Disposition: A | Discharge status: Home / Self Care | Payer: Medicare Other | Source: Ambulatory Visit | Attending: General Surgery | Admitting: General Surgery

## 2021-07-22 DIAGNOSIS — E079 Disorder of thyroid, unspecified: Secondary | ICD-10-CM

## 2021-07-29 ENCOUNTER — Other Ambulatory Visit: Payer: Self-pay | Admitting: Internal Medicine

## 2021-07-29 DIAGNOSIS — E041 Nontoxic single thyroid nodule: Secondary | ICD-10-CM

## 2021-08-15 ENCOUNTER — Other Ambulatory Visit (HOSPITAL_COMMUNITY)
Admission: RE | Admit: 2021-08-15 | Discharge: 2021-08-15 | Disposition: A | Discharge status: Home / Self Care | Payer: Medicare Other | Source: Ambulatory Visit | Attending: Physician Assistant | Admitting: Physician Assistant

## 2021-08-15 ENCOUNTER — Ambulatory Visit
Admission: RE | Admit: 2021-08-15 | Discharge: 2021-08-15 | Disposition: A | Discharge status: Home / Self Care | Payer: Medicare Other | Source: Ambulatory Visit | Attending: Internal Medicine | Admitting: Internal Medicine

## 2021-08-15 DIAGNOSIS — E041 Nontoxic single thyroid nodule: Secondary | ICD-10-CM

## 2021-08-16 LAB — CYTOLOGY - NON PAP

## 2021-09-19 ENCOUNTER — Other Ambulatory Visit: Payer: Self-pay | Admitting: Internal Medicine

## 2021-09-19 DIAGNOSIS — Z1231 Encounter for screening mammogram for malignant neoplasm of breast: Secondary | ICD-10-CM

## 2021-10-02 ENCOUNTER — Other Ambulatory Visit: Payer: Self-pay | Admitting: Internal Medicine

## 2021-10-02 DIAGNOSIS — E041 Nontoxic single thyroid nodule: Secondary | ICD-10-CM

## 2021-10-06 ENCOUNTER — Ambulatory Visit: Payer: Self-pay | Admitting: General Surgery

## 2021-10-06 DIAGNOSIS — K449 Diaphragmatic hernia without obstruction or gangrene: Secondary | ICD-10-CM

## 2021-10-21 NOTE — Progress Notes (Addendum)
COVID Vaccine Completed:  Yes x2 ?Date COVID Vaccine completed: ?Has received booster: ?COVID vaccine manufacturer: Pfizer     ? ?Date of COVID positive in last 90 days:  No ? ?PCP - Sueanne Margarita, DO ?Cardiologist - N/A ? ?Chest x-ray -  CT chest 06-27-21 Epic ?EKG - N/A ?Stress Test - N/A ?ECHO - N/A ?Cardiac Cath - N/A ?Pacemaker/ICD device last checked: ?Spinal Cord Stimulator: ? ?Bowel Prep - N/A ? ?Sleep Study - N/A ?CPAP -  ? ?Fasting Blood Sugar - N/A ?Checks Blood Sugar _____ times a day ? ?Blood Thinner Instructions:  N/A ?Aspirin Instructions: ?Last Dose: ? ?Activity level:   Can go up a flight of stairs and perform activities of daily living without stopping and without symptoms of chest pain or shortness of breath.  Able to exercise without symptoms ? ?Anesthesia review:  Heart murmur (patient evaluated by Lyndon Code, PA-C at PAT appointment, no testing needed) ? ?Patient denies shortness of breath, fever, cough and chest pain at PAT appointment ? ?Patient verbalized understanding of instructions that were given to them at the PAT appointment. Patient was also instructed that they will need to review over the PAT instructions again at home before surgery.  ?

## 2021-10-21 NOTE — Patient Instructions (Addendum)
DUE TO COVID-19 ONLY TWO VISITORS  (aged 68 and older)  IS ALLOWED TO COME WITH YOU AND STAY IN THE WAITING ROOM ONLY DURING PRE OP AND PROCEDURE.   ?**NO VISITORS ARE ALLOWED IN THE SHORT STAY AREA OR RECOVERY ROOM!!** ? ?IF YOU WILL BE ADMITTED INTO THE HOSPITAL YOU ARE ALLOWED ONLY FOUR SUPPORT PEOPLE DURING VISITATION HOURS ONLY (7 AM -8PM)   ?The support person(s) must pass our screening, gel in and out ?Visitors GUEST BADGE MUST BE WORN VISIBLY  ?One adult visitor may remain with you overnight and MUST be in the room by 8 P.M.  ? ?You are not required to quarantine ?Hand Hygiene often ?Do NOT share personal items ?Notify your provider if you are in close contact with someone who has COVID or you develop fever 100.4 or greater, new onset of sneezing, cough, sore throat, shortness of breath or body aches. ? ?     ? Your procedure is scheduled on:  11-04-21 ? ? Report to [facility] Main Entrance ? ?  Report to admitting at 5:15 AM ? ? Call this number if you have problems the morning of surgery [phone] ? ? Do not eat food :After Midnight. ? ? After Midnight you may have the following liquids until 4:30 AM/ DAY OF SURGERY ? ?Water ?Black Coffee (sugar ok, NO MILK/CREAM OR CREAMERS)  ?Tea (sugar ok, NO MILK/CREAM OR CREAMERS) regular and decaf                             ?Plain Jell-O (NO RED)                                           ?Fruit ices (not with fruit pulp, NO RED)                                     ?Popsicles (NO RED)                                                                  ?Juice: apple, WHITE grape, WHITE cranberry ?Sports drinks like Gatorade (NO RED) ?Clear broth(vegetable,chicken,beef) ? ?             ?  ?The day of surgery:  ?Drink ONE (1) Pre-Surgery Clear Ensure at 4:30 AM the morning of surgery. Drink in one sitting. Do not sip.  ?This drink was given to you during your hospital  ?pre-op appointment visit. ?Nothing else to drink after completing the Pre-Surgery Clear  Ensure. ?  ?       If you have questions, please contact your surgeon?s office. ? ? ?FOLLOW ANY ADDITIONAL PRE OP INSTRUCTIONS YOU RECEIVED FROM YOUR SURGEON'S OFFICE!!! ?  ?  ?Oral Hygiene is also important to reduce your risk of infection.                                    ?Remember - BRUSH YOUR TEETH THE  MORNING OF SURGERY WITH YOUR REGULAR TOOTHPASTE ? ? Do NOT smoke after Midnight ? ? Take these medicines the morning of surgery with A SIP OF WATER: None ?                  ?           You may not have any metal on your body including hair pins, jewelry, and body piercing ? ?           Do not wear make-up, lotions, powders, perfumes or deodorant ? ?Do not wear nail polish including gel and S&S, artificial/acrylic nails, or any other type of covering on natural nails including finger and toenails. If you have artificial nails, gel coating, etc. that needs to be removed by a nail salon please have this removed prior to surgery or surgery may need to be canceled/ delayed if the surgeon/ anesthesia feels like they are unable to be safely monitored.  ? ?Do not shave  48 hours prior to surgery.  ? ?       Do not bring valuables to the hospital. [place]. ? ? Contacts, dentures or bridgework may not be worn into surgery. ? ? Bring small overnight bag day of surgery. ? ?Please read over the following fact sheets you were given: IF [place]  ? ?[place] - Preparing for Surgery ?Before surgery, you can play an important role.  Because skin is not sterile, your skin needs to be as free of germs as possible.  You can reduce the number of germs on your skin by washing with CHG (chlorahexidine gluconate) soap before surgery.  CHG is an antiseptic cleaner which kills germs and bonds with the skin to continue killing germs even after washing. ?Please DO NOT use if you have an allergy to CHG or antibacterial soaps.  If  your skin becomes reddened/irritated stop using the CHG and inform your nurse when you arrive at Short Stay. ?Do not shave (including legs and underarms) for at least 48 hours prior to the first CHG shower.  You may shave your face/neck. ? ?Please follow these instructions carefully: ? 1.  Shower with CHG Soap the night before surgery and the  morning of surgery. ? 2.  If you choose to wash your hair, wash your hair first as usual with your normal  shampoo. ? 3.  After you shampoo, rinse your hair and body thoroughly to remove the shampoo.                            ? 4.  Use CHG as you would any other liquid soap.  You can apply chg directly to the skin and wash.  Gently with a scrungie or clean washcloth. ? 5.  Apply the CHG Soap to your body ONLY FROM THE NECK DOWN.   Do   not use on face/ open      ?                     Wound or open sores. Avoid contact with eyes, ears mouth and   genitals (private parts).  ?                     Production manager,  Genitals (private parts) with your normal soap. ?  6.  Wash thoroughly, paying special attention to the area where your    surgery  will be performed. ? 7.  Thoroughly rinse your body with warm water from the neck down. ? 8.  DO NOT shower/wash with your normal soap after using and rinsing off the CHG Soap. ?               9.  Pat yourself dry with a clean towel. ?           10.  Wear clean pajamas. ?           11.  Place clean sheets on your bed the night of your first shower and do not  sleep with pets. ?Day of Surgery : ?Do not apply any lotions/deodorants the morning of surgery.  Please wear clean clothes to the hospital/surgery center. ? ?FAILURE TO FOLLOW THESE INSTRUCTIONS MAY RESULT IN THE CANCELLATION OF YOUR SURGERY ? ?PATIENT SIGNATURE_________________________________ ? ?NURSE SIGNATURE__________________________________ ? ?________________________________________________________________________  ?  ?

## 2021-10-22 ENCOUNTER — Encounter (HOSPITAL_COMMUNITY)
Admission: RE | Admit: 2021-10-22 | Discharge: 2021-10-22 | Disposition: A | Discharge status: Home / Self Care | Payer: Medicare Other | Source: Ambulatory Visit | Attending: General Surgery | Admitting: General Surgery

## 2021-10-22 ENCOUNTER — Other Ambulatory Visit: Payer: Self-pay

## 2021-10-22 ENCOUNTER — Encounter (HOSPITAL_COMMUNITY): Payer: Self-pay

## 2021-10-22 DIAGNOSIS — Z01812 Encounter for preprocedural laboratory examination: Secondary | ICD-10-CM | POA: Diagnosis present

## 2021-10-22 DIAGNOSIS — K449 Diaphragmatic hernia without obstruction or gangrene: Secondary | ICD-10-CM | POA: Insufficient documentation

## 2021-10-22 HISTORY — DX: Anemia, unspecified: D64.9

## 2021-10-22 LAB — COMPREHENSIVE METABOLIC PANEL
ALT: 19 U/L (ref 0–44)
AST: 22 U/L (ref 15–41)
Albumin: 3.6 g/dL (ref 3.5–5.0)
Alkaline Phosphatase: 48 U/L (ref 38–126)
Anion gap: 6 (ref 5–15)
BUN: 12 mg/dL (ref 8–23)
CO2: 26 mmol/L (ref 22–32)
Calcium: 9.7 mg/dL (ref 8.9–10.3)
Chloride: 109 mmol/L (ref 98–111)
Creatinine, Ser: 0.78 mg/dL (ref 0.44–1.00)
GFR, Estimated: >60 mL/min (ref >60)
Glucose, Bld: 117 mg/dL — ABNORMAL HIGH (ref 70–99)
Potassium: 3.8 mmol/L (ref 3.5–5.1)
Sodium: 141 mmol/L (ref 135–145)
Total Bilirubin: 0.8 mg/dL (ref 0.3–1.2)
Total Protein: 7.4 g/dL (ref 6.5–8.1)

## 2021-10-22 LAB — CBC WITH DIFFERENTIAL/PLATELET
Abs Immature Granulocytes: 0.01 10*3/uL (ref 0.00–0.07)
Basophils Absolute: 0 10*3/uL (ref 0.0–0.1)
Basophils Relative: 1 %
Eosinophils Absolute: 0.1 10*3/uL (ref 0.0–0.5)
Eosinophils Relative: 2 %
HCT: 40.6 % (ref 36.0–46.0)
Hemoglobin: 13.2 g/dL (ref 12.0–15.0)
Immature Granulocytes: 0 %
Lymphocytes Relative: 45 %
Lymphs Abs: 1.8 10*3/uL (ref 0.7–4.0)
MCH: 30.4 pg (ref 26.0–34.0)
MCHC: 32.5 g/dL (ref 30.0–36.0)
MCV: 93.5 fL (ref 80.0–100.0)
Monocytes Absolute: 0.4 10*3/uL (ref 0.1–1.0)
Monocytes Relative: 9 %
Neutro Abs: 1.7 10*3/uL (ref 1.7–7.7)
Neutrophils Relative %: 43 %
Platelets: 296 10*3/uL (ref 150–400)
RBC: 4.34 MIL/uL (ref 3.87–5.11)
RDW: 14.2 % (ref 11.5–15.5)
WBC: 3.9 10*3/uL — ABNORMAL LOW (ref 4.0–10.5)
nRBC: 0 % (ref 0.0–0.2)

## 2021-11-03 NOTE — Anesthesia Preprocedure Evaluation (Addendum)
Anesthesia Evaluation  ?Patient identified by MRN, date of birth, ID band ?Patient awake ? ? ? ?Reviewed: ?Allergy & Precautions, NPO status , Patient's Chart, lab work & pertinent test results ? ?Airway ?Mallampati: II ? ?TM Distance: >3 FB ?Neck ROM: Full ? ? ? Dental ? ?(+) Missing, Edentulous Upper ?  ?Pulmonary ?asthma , former smoker,  ?  ?Pulmonary exam normal ? ? ? ? ? ? ? Cardiovascular ?negative cardio ROS ?Normal cardiovascular exam ? ? ?  ?Neuro/Psych ?negative neurological ROS ? negative psych ROS  ? GI/Hepatic ?Neg liver ROS,   ?Endo/Other  ?negative endocrine ROS ? Renal/GU ?negative Renal ROS  ? ?  ?Musculoskeletal ? ?(+) Arthritis ,  ? Abdominal ?  ?Peds ? Hematology ?negative hematology ROS ?(+)   ?Anesthesia Other Findings ?PARAESOPHAGEAL HERNIA ? Reproductive/Obstetrics ? ?  ? ? ? ? ? ? ? ? ? ? ? ? ? ?  ?  ? ? ? ? ? ? ? ?Anesthesia Physical ?Anesthesia Plan ? ?ASA: 2 ? ?Anesthesia Plan: General  ? ?Post-op Pain Management:   ? ?Induction: Intravenous ? ?PONV Risk Score and Plan: 4 or greater and Ondansetron, Dexamethasone and Treatment may vary due to age or medical condition ? ?Airway Management Planned: Oral ETT ? ?Additional Equipment:  ? ?Intra-op Plan:  ? ?Post-operative Plan: Extubation in OR ? ?Informed Consent: I have reviewed the patients History and Physical, chart, labs and discussed the procedure including the risks, benefits and alternatives for the proposed anesthesia with the patient or authorized representative who has indicated his/her understanding and acceptance.  ? ? ? ?Dental advisory given ? ?Plan Discussed with: CRNA ? ?Anesthesia Plan Comments:   ? ? ? ? ? ?Anesthesia Quick Evaluation ? ?

## 2021-11-04 ENCOUNTER — Ambulatory Visit (HOSPITAL_BASED_OUTPATIENT_CLINIC_OR_DEPARTMENT_OTHER): Payer: Medicare Other | Admitting: Certified Registered Nurse Anesthetist

## 2021-11-04 ENCOUNTER — Other Ambulatory Visit: Payer: Self-pay

## 2021-11-04 ENCOUNTER — Ambulatory Visit (HOSPITAL_COMMUNITY): Payer: Medicare Other | Admitting: Physician Assistant

## 2021-11-04 ENCOUNTER — Encounter (HOSPITAL_COMMUNITY): Payer: Self-pay | Admitting: General Surgery

## 2021-11-04 ENCOUNTER — Encounter (HOSPITAL_COMMUNITY)
Admission: RE | Disposition: A | Discharge status: Home / Self Care | Payer: Self-pay | Source: Home / Self Care | Attending: General Surgery

## 2021-11-04 ENCOUNTER — Observation Stay (HOSPITAL_COMMUNITY)
Admission: RE | Admit: 2021-11-04 | Discharge: 2021-11-05 | Disposition: A | Discharge status: Home / Self Care | Payer: Medicare Other | Attending: General Surgery | Admitting: General Surgery

## 2021-11-04 DIAGNOSIS — K449 Diaphragmatic hernia without obstruction or gangrene: Secondary | ICD-10-CM

## 2021-11-04 DIAGNOSIS — J45909 Unspecified asthma, uncomplicated: Secondary | ICD-10-CM | POA: Diagnosis not present

## 2021-11-04 DIAGNOSIS — Z8719 Personal history of other diseases of the digestive system: Secondary | ICD-10-CM

## 2021-11-04 DIAGNOSIS — Z87891 Personal history of nicotine dependence: Secondary | ICD-10-CM | POA: Insufficient documentation

## 2021-11-04 HISTORY — PX: XI ROBOTIC ASSISTED PARAESOPHAGEAL HERNIA REPAIR: SHX6871

## 2021-11-04 LAB — CREATININE, SERUM
Creatinine, Ser: 0.78 mg/dL (ref 0.44–1.00)
GFR, Estimated: >60 mL/min (ref >60)

## 2021-11-04 SURGERY — REPAIR, HERNIA, PARAESOPHAGEAL, ROBOT-ASSISTED
Anesthesia: General

## 2021-11-04 MED ORDER — PHENYLEPHRINE 80 MCG/ML (10ML) SYRINGE FOR IV PUSH (FOR BLOOD PRESSURE SUPPORT)
PREFILLED_SYRINGE | INTRAVENOUS | Status: DC | PRN
  Administered 2021-11-04 (×4): 80 ug via INTRAVENOUS

## 2021-11-04 MED ORDER — KCL IN DEXTROSE-NACL 20-5-0.45 MEQ/L-%-% IV SOLN
INTRAVENOUS | Status: DC
  Filled 2021-11-04 (×3): qty 1000

## 2021-11-04 MED ORDER — OXYCODONE HCL 5 MG PO TABS
5.0000 mg | ORAL_TABLET | ORAL | Status: DC | PRN
  Administered 2021-11-04: 10 mg via ORAL
  Filled 2021-11-04: qty 2

## 2021-11-04 MED ORDER — BUPIVACAINE LIPOSOME 1.3 % IJ SUSP
INTRAMUSCULAR | Status: DC | PRN
  Administered 2021-11-04: 20 mL

## 2021-11-04 MED ORDER — MORPHINE SULFATE (PF) 2 MG/ML IV SOLN
1.0000 mg | INTRAVENOUS | Status: DC | PRN
  Administered 2021-11-04: 1 mg via INTRAVENOUS
  Filled 2021-11-04: qty 1

## 2021-11-04 MED ORDER — KETOROLAC TROMETHAMINE 15 MG/ML IJ SOLN
15.0000 mg | Freq: Three times a day (TID) | INTRAMUSCULAR | Status: DC
  Administered 2021-11-04 – 2021-11-05 (×3): 15 mg via INTRAVENOUS
  Filled 2021-11-04 (×3): qty 1

## 2021-11-04 MED ORDER — PROPOFOL 500 MG/50ML IV EMUL
INTRAVENOUS | Status: DC | PRN
  Administered 2021-11-04: 25 ug/kg/min via INTRAVENOUS

## 2021-11-04 MED ORDER — ACETAMINOPHEN 500 MG PO TABS
1000.0000 mg | ORAL_TABLET | Freq: Four times a day (QID) | ORAL | Status: DC
  Administered 2021-11-04: 1000 mg via ORAL
  Filled 2021-11-04: qty 2

## 2021-11-04 MED ORDER — LIDOCAINE HCL (PF) 2 % IJ SOLN
INTRAMUSCULAR | Status: AC
  Filled 2021-11-04: qty 5

## 2021-11-04 MED ORDER — SUGAMMADEX SODIUM 200 MG/2ML IV SOLN
INTRAVENOUS | Status: DC | PRN
  Administered 2021-11-04: 200 mg via INTRAVENOUS

## 2021-11-04 MED ORDER — KETOROLAC TROMETHAMINE 15 MG/ML IJ SOLN: 15.0000 mg | Freq: Once | INTRAMUSCULAR | Status: DC | PRN

## 2021-11-04 MED ORDER — SCOPOLAMINE 1 MG/3DAYS TD PT72
1.0000 | MEDICATED_PATCH | TRANSDERMAL | Status: DC
Start: 2021-11-04 — End: 2021-11-04
  Administered 2021-11-04: 1.5 mg via TRANSDERMAL
  Filled 2021-11-04: qty 1

## 2021-11-04 MED ORDER — ROCURONIUM BROMIDE 10 MG/ML (PF) SYRINGE
PREFILLED_SYRINGE | INTRAVENOUS | Status: DC | PRN
  Administered 2021-11-04: 20 mg via INTRAVENOUS
  Administered 2021-11-04: 30 mg via INTRAVENOUS
  Administered 2021-11-04: 20 mg via INTRAVENOUS
  Administered 2021-11-04: 50 mg via INTRAVENOUS

## 2021-11-04 MED ORDER — LIDOCAINE 2% (20 MG/ML) 5 ML SYRINGE
INTRAMUSCULAR | Status: DC | PRN
  Administered 2021-11-04: 50 mg via INTRAVENOUS

## 2021-11-04 MED ORDER — ACETAMINOPHEN 160 MG/5ML PO SOLN
1000.0000 mg | Freq: Four times a day (QID) | ORAL | Status: DC
Start: 2021-11-04 — End: 2021-11-05
  Administered 2021-11-04 – 2021-11-05 (×4): 1000 mg via ORAL
  Filled 2021-11-04 (×4): qty 40.6

## 2021-11-04 MED ORDER — PROPOFOL 1000 MG/100ML IV EMUL
INTRAVENOUS | Status: AC
  Filled 2021-11-04: qty 100

## 2021-11-04 MED ORDER — HEPARIN SODIUM (PORCINE) 5000 UNIT/ML IJ SOLN
5000.0000 [IU] | Freq: Once | INTRAMUSCULAR | Status: AC
  Administered 2021-11-04: 5000 [IU] via SUBCUTANEOUS
  Filled 2021-11-04: qty 1

## 2021-11-04 MED ORDER — AMISULPRIDE (ANTIEMETIC) 5 MG/2ML IV SOLN: 10.0000 mg | Freq: Once | INTRAVENOUS | Status: DC | PRN

## 2021-11-04 MED ORDER — FENTANYL CITRATE (PF) 250 MCG/5ML IJ SOLN
INTRAMUSCULAR | Status: AC
  Filled 2021-11-04: qty 5

## 2021-11-04 MED ORDER — DIPHENHYDRAMINE HCL 12.5 MG/5ML PO ELIX: 12.5000 mg | ORAL_SOLUTION | Freq: Four times a day (QID) | ORAL | Status: DC | PRN

## 2021-11-04 MED ORDER — DEXAMETHASONE SODIUM PHOSPHATE 4 MG/ML IJ SOLN: 4.0000 mg | INTRAMUSCULAR | Status: DC

## 2021-11-04 MED ORDER — PHENYLEPHRINE HCL-NACL 20-0.9 MG/250ML-% IV SOLN
INTRAVENOUS | Status: AC
  Filled 2021-11-04: qty 250

## 2021-11-04 MED ORDER — CHLORHEXIDINE GLUCONATE CLOTH 2 % EX PADS: 6.0000 | MEDICATED_PAD | Freq: Once | CUTANEOUS | Status: DC

## 2021-11-04 MED ORDER — PANTOPRAZOLE SODIUM 40 MG IV SOLR
40.0000 mg | Freq: Every day | INTRAVENOUS | Status: DC
  Administered 2021-11-04: 40 mg via INTRAVENOUS
  Filled 2021-11-04: qty 10

## 2021-11-04 MED ORDER — PHENYLEPHRINE HCL-NACL 20-0.9 MG/250ML-% IV SOLN
INTRAVENOUS | Status: DC | PRN
  Administered 2021-11-04: 30 ug/min via INTRAVENOUS

## 2021-11-04 MED ORDER — DEXAMETHASONE SODIUM PHOSPHATE 4 MG/ML IJ SOLN
INTRAMUSCULAR | Status: DC | PRN
  Administered 2021-11-04: 10 mg via INTRAVENOUS

## 2021-11-04 MED ORDER — SIMETHICONE 80 MG PO CHEW: 40.0000 mg | CHEWABLE_TABLET | Freq: Four times a day (QID) | ORAL | Status: DC | PRN

## 2021-11-04 MED ORDER — PROCHLORPERAZINE EDISYLATE 10 MG/2ML IJ SOLN: 10.0000 mg | Freq: Four times a day (QID) | INTRAMUSCULAR | Status: DC | PRN

## 2021-11-04 MED ORDER — EPHEDRINE 5 MG/ML INJ
INTRAVENOUS | Status: AC
  Filled 2021-11-04: qty 5

## 2021-11-04 MED ORDER — ENSURE PRE-SURGERY PO LIQD
296.0000 mL | Freq: Once | ORAL | Status: DC
Start: 2021-11-05 — End: 2021-11-04
  Filled 2021-11-04: qty 296

## 2021-11-04 MED ORDER — SODIUM CHLORIDE (PF) 0.9 % IJ SOLN
INTRAMUSCULAR | Status: AC
  Filled 2021-11-04: qty 50

## 2021-11-04 MED ORDER — BUPIVACAINE LIPOSOME 1.3 % IJ SUSP: 20.0000 mL | Freq: Once | INTRAMUSCULAR | Status: DC

## 2021-11-04 MED ORDER — FENTANYL CITRATE (PF) 100 MCG/2ML IJ SOLN
INTRAMUSCULAR | Status: DC | PRN
  Administered 2021-11-04 (×5): 50 ug via INTRAVENOUS

## 2021-11-04 MED ORDER — ORAL CARE MOUTH RINSE: 15.0000 mL | Freq: Once | OROMUCOSAL | Status: AC

## 2021-11-04 MED ORDER — EPHEDRINE SULFATE-NACL 50-0.9 MG/10ML-% IV SOSY
PREFILLED_SYRINGE | INTRAVENOUS | Status: DC | PRN
  Administered 2021-11-04 (×2): 5 mg via INTRAVENOUS

## 2021-11-04 MED ORDER — ACETAMINOPHEN 500 MG PO TABS
1000.0000 mg | ORAL_TABLET | ORAL | Status: AC
  Administered 2021-11-04: 1000 mg via ORAL
  Filled 2021-11-04: qty 2

## 2021-11-04 MED ORDER — METHOCARBAMOL 500 MG PO TABS: 500.0000 mg | ORAL_TABLET | Freq: Four times a day (QID) | ORAL | Status: DC | PRN

## 2021-11-04 MED ORDER — PROPOFOL 10 MG/ML IV BOLUS
INTRAVENOUS | Status: DC | PRN
Start: 2021-11-04 — End: 2021-11-04
  Administered 2021-11-04: 100 mg via INTRAVENOUS

## 2021-11-04 MED ORDER — LACTATED RINGERS IV SOLN
INTRAVENOUS | Status: DC
  Administered 2021-11-04: 1000 mL via INTRAVENOUS

## 2021-11-04 MED ORDER — ONDANSETRON HCL 4 MG/2ML IJ SOLN: 4.0000 mg | Freq: Once | INTRAMUSCULAR | Status: DC | PRN

## 2021-11-04 MED ORDER — CIPROFLOXACIN IN D5W 400 MG/200ML IV SOLN
400.0000 mg | INTRAVENOUS | Status: AC
  Administered 2021-11-04: 400 mg via INTRAVENOUS
  Filled 2021-11-04: qty 200

## 2021-11-04 MED ORDER — DIPHENHYDRAMINE HCL 50 MG/ML IJ SOLN: 12.5000 mg | Freq: Four times a day (QID) | INTRAMUSCULAR | Status: DC | PRN

## 2021-11-04 MED ORDER — ONDANSETRON HCL 4 MG/2ML IJ SOLN
INTRAMUSCULAR | Status: DC | PRN
  Administered 2021-11-04: 4 mg via INTRAVENOUS

## 2021-11-04 MED ORDER — ONDANSETRON HCL 4 MG/2ML IJ SOLN: 4.0000 mg | Freq: Four times a day (QID) | INTRAMUSCULAR | Status: DC | PRN

## 2021-11-04 MED ORDER — FENTANYL CITRATE PF 50 MCG/ML IJ SOSY
25.0000 ug | PREFILLED_SYRINGE | INTRAMUSCULAR | Status: DC | PRN
  Administered 2021-11-04: 50 ug via INTRAVENOUS

## 2021-11-04 MED ORDER — BUPIVACAINE LIPOSOME 1.3 % IJ SUSP
INTRAMUSCULAR | Status: AC
  Filled 2021-11-04: qty 20

## 2021-11-04 MED ORDER — ROCURONIUM BROMIDE 10 MG/ML (PF) SYRINGE
PREFILLED_SYRINGE | INTRAVENOUS | Status: AC
  Filled 2021-11-04: qty 10

## 2021-11-04 MED ORDER — LACTATED RINGERS IV SOLN
INTRAVENOUS | Status: AC | PRN
  Administered 2021-11-04: 1000 mL via INTRAVENOUS

## 2021-11-04 MED ORDER — ENOXAPARIN SODIUM 40 MG/0.4ML IJ SOSY
40.0000 mg | PREFILLED_SYRINGE | INTRAMUSCULAR | Status: DC
  Administered 2021-11-05: 40 mg via SUBCUTANEOUS
  Filled 2021-11-04: qty 0.4

## 2021-11-04 MED ORDER — ONDANSETRON 4 MG PO TBDP: 4.0000 mg | ORAL_TABLET | Freq: Four times a day (QID) | ORAL | Status: DC | PRN

## 2021-11-04 MED ORDER — SODIUM CHLORIDE (PF) 0.9 % IJ SOLN
INTRAMUSCULAR | Status: DC | PRN
  Administered 2021-11-04: 50 mL

## 2021-11-04 MED ORDER — 0.9 % SODIUM CHLORIDE (POUR BTL) OPTIME
TOPICAL | Status: DC | PRN
  Administered 2021-11-04: 1000 mL

## 2021-11-04 MED ORDER — CHLORHEXIDINE GLUCONATE 0.12 % MT SOLN
15.0000 mL | Freq: Once | OROMUCOSAL | Status: AC
  Administered 2021-11-04: 15 mL via OROMUCOSAL

## 2021-11-04 MED ORDER — FENTANYL CITRATE PF 50 MCG/ML IJ SOSY
PREFILLED_SYRINGE | INTRAMUSCULAR | Status: AC
  Filled 2021-11-04: qty 3

## 2021-11-04 MED ORDER — HYDRALAZINE HCL 20 MG/ML IJ SOLN
10.0000 mg | INTRAMUSCULAR | Status: DC | PRN
  Administered 2021-11-04: 10 mg via INTRAVENOUS
  Filled 2021-11-04: qty 1

## 2021-11-04 SURGICAL SUPPLY — 67 items
APPLIER CLIP 5 13 M/L LIGAMAX5 (MISCELLANEOUS)
APPLIER CLIP ROT 10 11.4 M/L (STAPLE)
BLADE SURG SZ11 CARB STEEL (BLADE) ×2 IMPLANT
CHLORAPREP W/TINT 26 (MISCELLANEOUS) ×2 IMPLANT
CLIP APPLIE 5 13 M/L LIGAMAX5 (MISCELLANEOUS) IMPLANT
CLIP APPLIE ROT 10 11.4 M/L (STAPLE) IMPLANT
CLSR STERI-STRIP ANTIMIC 1/2X4 (GAUZE/BANDAGES/DRESSINGS) ×1 IMPLANT
COVER SURGICAL LIGHT HANDLE (MISCELLANEOUS) ×2 IMPLANT
COVER TIP SHEARS 8 DVNC (MISCELLANEOUS) IMPLANT
COVER TIP SHEARS 8MM DA VINCI (MISCELLANEOUS) ×2
DERMABOND ADVANCED (GAUZE/BANDAGES/DRESSINGS) ×1
DERMABOND ADVANCED .7 DNX12 (GAUZE/BANDAGES/DRESSINGS) IMPLANT
DRAIN PENROSE 0.5X18 (DRAIN) ×1 IMPLANT
DRAPE ARM DVNC X/XI (DISPOSABLE) ×4 IMPLANT
DRAPE COLUMN DVNC XI (DISPOSABLE) ×1 IMPLANT
DRAPE DA VINCI XI ARM (DISPOSABLE) ×8
DRAPE DA VINCI XI COLUMN (DISPOSABLE) ×2
DRSG TEGADERM 2-3/8X2-3/4 SM (GAUZE/BANDAGES/DRESSINGS) ×6 IMPLANT
ELECT REM PT RETURN 15FT ADLT (MISCELLANEOUS) ×2 IMPLANT
GAUZE 4X4 16PLY ~~LOC~~+RFID DBL (SPONGE) ×2 IMPLANT
GAUZE SPONGE 2X2 8PLY STRL LF (GAUZE/BANDAGES/DRESSINGS) ×1 IMPLANT
GLOVE BIO SURGEON STRL SZ 6 (GLOVE) ×6 IMPLANT
GLOVE BIOGEL PI IND STRL 8 (GLOVE) ×1 IMPLANT
GLOVE BIOGEL PI INDICATOR 8 (GLOVE) ×1
GLOVE INDICATOR 6.5 STRL GRN (GLOVE) ×6 IMPLANT
GLOVE SURG LX 8.0 MICRO (GLOVE) ×3
GLOVE SURG LX STRL 8.0 MICRO (GLOVE) ×3 IMPLANT
GOWN STRL REUS W/ TWL XL LVL3 (GOWN DISPOSABLE) IMPLANT
GOWN STRL REUS W/TWL XL LVL3 (GOWN DISPOSABLE)
GRASPER SUT TROCAR 14GX15 (MISCELLANEOUS) ×1 IMPLANT
IRRIG SUCT STRYKERFLOW 2 WTIP (MISCELLANEOUS) ×2
IRRIGATION SUCT STRKRFLW 2 WTP (MISCELLANEOUS) ×1 IMPLANT
KIT BASIN OR (CUSTOM PROCEDURE TRAY) ×2 IMPLANT
KIT TURNOVER KIT A (KITS) ×1 IMPLANT
LUBRICANT JELLY K Y 4OZ (MISCELLANEOUS) IMPLANT
MARKER SKIN DUAL TIP RULER LAB (MISCELLANEOUS) ×1 IMPLANT
MESH BIO-A 7X10 SYN MAT (Mesh General) ×1 IMPLANT
NEEDLE HYPO 22GX1.5 SAFETY (NEEDLE) ×2 IMPLANT
PACK CARDIOVASCULAR III (CUSTOM PROCEDURE TRAY) ×2 IMPLANT
PAD POSITIONING PINK XL (MISCELLANEOUS) ×2 IMPLANT
SCISSORS LAP 5X35 DISP (ENDOMECHANICALS) ×1 IMPLANT
SEAL CANN UNIV 5-8 DVNC XI (MISCELLANEOUS) ×4 IMPLANT
SEAL XI 5MM-8MM UNIVERSAL (MISCELLANEOUS) ×8
SEALER VESSEL DA VINCI XI (MISCELLANEOUS) ×2
SEALER VESSEL EXT DVNC XI (MISCELLANEOUS) ×1 IMPLANT
SOL ANTI FOG 6CC (MISCELLANEOUS) ×1 IMPLANT
SOLUTION ANTI FOG 6CC (MISCELLANEOUS) ×1
SOLUTION ELECTROLUBE (MISCELLANEOUS) ×2 IMPLANT
SPIKE FLUID TRANSFER (MISCELLANEOUS) ×1 IMPLANT
SPONGE GAUZE 2X2 STER 10/PKG (GAUZE/BANDAGES/DRESSINGS)
SUT ETHIBOND 0 36 GRN (SUTURE) ×6 IMPLANT
SUT MNCRL AB 4-0 PS2 18 (SUTURE) ×3 IMPLANT
SUT SILK 0 SH 30 (SUTURE) IMPLANT
SUT SILK 2 0 SH (SUTURE) IMPLANT
SUT VIC AB 0 UR5 27 (SUTURE) ×1 IMPLANT
SYR 10ML ECCENTRIC (SYRINGE) ×2 IMPLANT
SYR 20ML LL LF (SYRINGE) ×2 IMPLANT
TIP INNERVISION DETACH 40FR (MISCELLANEOUS) IMPLANT
TIP INNERVISION DETACH 50FR (MISCELLANEOUS) IMPLANT
TIP INNERVISION DETACH 56FR (MISCELLANEOUS) ×1 IMPLANT
TIPS INNERVISION DETACH 40FR (MISCELLANEOUS)
TOWEL OR 17X26 10 PK STRL BLUE (TOWEL DISPOSABLE) ×2 IMPLANT
TRAY FOLEY MTR SLVR 16FR STAT (SET/KITS/TRAYS/PACK) IMPLANT
TROCAR ADV FIXATION 12X100MM (TROCAR) ×1 IMPLANT
TROCAR ADV FIXATION 5X100MM (TROCAR) IMPLANT
TROCAR BLADELESS OPT 5 100 (ENDOMECHANICALS) ×2 IMPLANT
TUBING INSUFFLATION 10FT LAP (TUBING) ×2 IMPLANT

## 2021-11-04 NOTE — Transfer of Care (Signed)
Immediate Anesthesia Transfer of Care Note ? ?Patient: Lori Cummings ? ?Procedure(s) Performed: XI ROBOTIC ASSISTED PARAESOPHAGEAL HERNIA REPAIR WITH POSSIBLE MESH PARTIAL WRAP ? ?Patient Location: PACU ? ?Anesthesia Type:General ? ?Level of Consciousness: awake and patient cooperative ? ?Airway & Oxygen Therapy: Patient Spontanous Breathing and Patient connected to face mask ? ?Post-op Assessment: Report given to RN and Post -op Vital signs reviewed and stable ? ?Post vital signs: Reviewed and stable ? ?Last Vitals:  ?Vitals Value Taken Time  ?BP 144/79 11/04/21 1113  ?Temp    ?Pulse 94 11/04/21 1113  ?Resp 20 11/04/21 1114  ?SpO2 100 % 11/04/21 1113  ?Vitals shown include unvalidated device data. ? ?Last Pain:  ?Vitals:  ? 11/04/21 0604  ?TempSrc:   ?PainSc: 0-No pain  ?   ? ?  ? ?Complications: No notable events documented. ?

## 2021-11-04 NOTE — Brief Op Note (Signed)
11/04/2021 ? ?11:44 AM ? ?PATIENT:  Lori Cummings  66 y.o. female ? ?PRE-OPERATIVE DIAGNOSIS:  PARAESOPHAGEAL HERNIA ? ?POST-OPERATIVE DIAGNOSIS:  PARAESOPHAGEAL HERNIA ? ?PROCEDURE:  Procedure(s) with comments: ?XI ROBOTIC ASSISTED PARAESOPHAGEAL HERNIA REPAIR WITH MESH & PARTIAL WRAP (N/A) - with upper endoscopy ?LAPAROSCOPIC BILATERAL TAP BLOCK ? ?SURGEON:  Surgeon(s) and Role: ?   Greer Pickerel, MD - Primary ?   Johnathan Hausen, MD - Assisting ? ? ? ?PHYSICIAN ASSISTANT:  ? ?ASSISTANTS: see above  ? ?ANESTHESIA:   general ? ?EBL:  25 mL  ? ?BLOOD ADMINISTERED:none ? ?DRAINS: none  ? ?LOCAL MEDICATIONS USED:  OTHER exparel ? ?SPECIMEN:  Source of Specimen:  hernia sac ? ?DISPOSITION OF SPECIMEN:   discarded ? ?COUNTS:  YES ? ?TOURNIQUET:  * No tourniquets in log * ? ?DICTATION: .Other Dictation: Dictation Number [id number] ? ?PLAN OF CARE: Admit for overnight observation ? ?PATIENT DISPOSITION:  PACU - hemodynamically stable. ?  ?Delay start of Pharmacological VTE agent (>24hrs) due to surgical blood loss or risk of bleeding: no ? ?Leighton Ruff. Redmond Pulling, MD, FACS ?General, Bariatric, & Minimally Invasive Surgery ?[facility] ? ? ?

## 2021-11-04 NOTE — Anesthesia Postprocedure Evaluation (Signed)
Anesthesia Post Note ? ?Patient: Lori Cummings ? ?Procedure(s) Performed: XI ROBOTIC ASSISTED PARAESOPHAGEAL HERNIA REPAIR WITH POSSIBLE MESH PARTIAL WRAP ? ?  ? ?Patient location during evaluation: PACU ?Anesthesia Type: General ?Level of consciousness: awake ?Pain management: pain level controlled ?Vital Signs Assessment: post-procedure vital signs reviewed and stable ?Respiratory status: spontaneous breathing, nonlabored ventilation, respiratory function stable and patient connected to nasal cannula oxygen ?Cardiovascular status: blood pressure returned to baseline and stable ?Postop Assessment: no apparent nausea or vomiting ?Anesthetic complications: no ? ? ?No notable events documented. ? ?Last Vitals:  ?Vitals:  ? 11/04/21 1429 11/04/21 1525  ?BP: 137/79 122/74  ?Pulse: 92 88  ?Resp: 18 18  ?Temp: (!) 36.4 ?C 37 ?C  ?SpO2: 100% 100%  ?  ?Last Pain:  ?Vitals:  ? 11/04/21 1525  ?TempSrc: Oral  ?PainSc:   ? ? ?  ?  ?  ?  ?  ?  ? ?Quanta Roher P Chester Romero ? ? ? ? ?

## 2021-11-04 NOTE — H&P (Signed)
?CC: here for surgery ? ?Requesting provider: cunningham ? ?HPI: ?CAASI GIGLIA is an 66 y.o. female who is here for robotic repair of large paraesophageal hiatal hernia with poss mesh and poss g tube with egd.  Says her eating has a gotten a little worse.  O/w denies medical changes since seen in clinic ? ?LYNNIAH JANOSKI is a 66 y.o. female who is seen today for follow-up regarding her recent biopsy of her thyroid nodule as well as to further discuss her large paraesophageal hiatal hernia. sHe is accompanied by her sister today. She denies any medical changes since I last saw her last month however she states that she is now having some chest discomfort when she eats. Normally she just has discomfort in her left upper quadrant. She denies any heartburn. She denies any pain with swallowing liquids or solids. She cannot eat large portions. She has to be very careful with what she eats. Otherwise she denies any changes ? ?Past Medical History:  ?Diagnosis Date  ? Allergy   ? Anemia   ? Teens  ? Arthritis   ? Asthma   ? Cataract   ? Diverticulosis   ? Family history of colon cancer   ? sister  ? Heart murmur   ? History of colon polyps   ? HLD (hyperlipidemia)   ? ? ?Past Surgical History:  ?Procedure Laterality Date  ? COLONOSCOPY W/ POLYPECTOMY    ? TONSILLECTOMY    ? TOOTH EXTRACTION    ? TOTAL ABDOMINAL HYSTERECTOMY    ? UPPER GASTROINTESTINAL ENDOSCOPY    ? ? ?Family History  ?Problem Relation Age of Onset  ? Heart disease Mother   ? Dementia Mother   ? Hypertension Mother   ? Colon polyps Father   ? Hypertension Father   ? Colon cancer Sister 38  ? Heart disease Brother   ? Breast cancer Maternal Aunt   ? Kidney cancer Maternal Aunt   ? Dementia Paternal Aunt   ? Hypertension Sister   ? Stomach cancer Neg Hx   ? Rectal cancer Neg Hx   ? Esophageal cancer Neg Hx   ? ? ?Social:  reports that she quit smoking about 17 years ago. Her smoking use included cigarettes. She has never used smokeless tobacco. She reports  current alcohol use. She reports that she does not currently use drugs. ? ?Allergies:  ?Allergies  ?Allergen Reactions  ? Amoxicillin Itching and Rash  ? Penicillins Itching and Rash  ? ? ?Medications: I have reviewed the patient's current medications. ? ? ?ROS - all of the below systems have been reviewed with the patient and positives are indicated with bold text ?General: chills, fever or night sweats ?Eyes: blurry vision or double vision ?ENT: epistaxis or sore throat ?Allergy/Immunology: itchy/watery eyes or nasal congestion ?Hematologic/Lymphatic: bleeding problems, blood clots or swollen lymph nodes ?Endocrine: temperature intolerance or unexpected weight changes ?Breast: new or changing breast lumps or nipple discharge ?Resp: cough, shortness of breath, or wheezing ?CV: chest pain or dyspnea on exertion ?GI: as per HPI ?GU: dysuria, trouble voiding, or hematuria ?MSK: joint pain or joint stiffness ?Neuro: TIA or stroke symptoms ?Derm: pruritus and skin lesion changes ?Psych: anxiety and depression ? ?PE ?Blood pressure (!) 141/82, pulse 84, temperature 97.6 ?F (36.4 ?C), temperature source Oral, resp. rate 18, height '5\' 3"'$  (1.6 m), weight 74.3 kg, SpO2 99 %. ?Constitutional: NAD; conversant; no deformities ?Eyes: Moist conjunctiva; no lid lag; anicteric; PERRL ?Neck: Trachea midline; no thyromegaly ?  Lungs: Normal respiratory effort; no tactile fremitus ?CV: RRR; no palpable thrills; no pitting edema ?GI: Abd soft, nt, nd; no palpable hepatosplenomegaly ?MSK: Normal gait; no clubbing/cyanosis ?Psychiatric: Appropriate affect; alert and oriented x3 ?Lymphatic: No palpable cervical or axillary lymphadenopathy ?Skin:no rash/lesions ? ?No results found for this or any previous visit (from the past 48 hour(s)). ? ?No results found. ? ?Imaging: ?reviewed ? ?A/P: ?CORTNI TAYS is an 66 y.o. female with symptomatic large PEH ?To OR for robotic repair of large paraesophageal hiatal hernia with poss mesh and poss g  tube with egd ?Rediscussed steps of the procedure ?IV abx  ?ERAS protocol ? ? ? ?Leighton Ruff. Redmond Pulling, MD, FACS ?General, Bariatric, & Minimally Invasive Surgery ?[facility] ? ? ?

## 2021-11-04 NOTE — Anesthesia Procedure Notes (Signed)
Procedure Name: Intubation ?Date/Time: 11/04/2021 7:40 AM ?Performed by: Claudia Desanctis, CRNA ?Pre-anesthesia Checklist: Patient identified, Emergency Drugs available, Suction available and Patient being monitored ?Patient Re-evaluated:Patient Re-evaluated prior to induction ?Oxygen Delivery Method: Circle system utilized ?Preoxygenation: Pre-oxygenation with 100% oxygen ?Induction Type: IV induction ?Ventilation: Mask ventilation without difficulty and Oral airway inserted - appropriate to patient size ?Laryngoscope Size: 2 and Miller ?Grade View: Grade I ?Tube type: Oral ?Tube size: 7.0 mm ?Number of attempts: 1 ?Airway Equipment and Method: Stylet ?Placement Confirmation: ETT inserted through vocal cords under direct vision, positive ETCO2 and breath sounds checked- equal and bilateral ?Secured at: 23 cm ?Tube secured with: Tape ?Dental Injury: Teeth and Oropharynx as per pre-operative assessment  ?Comments: DL and intubation by Liane Comber, SRNA. Atraumatic, +etCO2 and bilateral breath sounds.  ? ? ? ? ?

## 2021-11-05 ENCOUNTER — Observation Stay (HOSPITAL_COMMUNITY): Payer: Medicare Other

## 2021-11-05 ENCOUNTER — Encounter (HOSPITAL_COMMUNITY): Payer: Self-pay | Admitting: General Surgery

## 2021-11-05 DIAGNOSIS — K449 Diaphragmatic hernia without obstruction or gangrene: Secondary | ICD-10-CM | POA: Diagnosis not present

## 2021-11-05 LAB — MAGNESIUM: Magnesium: 2 mg/dL (ref 1.7–2.4)

## 2021-11-05 LAB — CBC
HCT: 34.4 % — ABNORMAL LOW (ref 36.0–46.0)
Hemoglobin: 11.3 g/dL — ABNORMAL LOW (ref 12.0–15.0)
MCH: 31.1 pg (ref 26.0–34.0)
MCHC: 32.8 g/dL (ref 30.0–36.0)
MCV: 94.8 fL (ref 80.0–100.0)
Platelets: 238 10*3/uL (ref 150–400)
RBC: 3.63 MIL/uL — ABNORMAL LOW (ref 3.87–5.11)
RDW: 14 % (ref 11.5–15.5)
WBC: 6.7 10*3/uL (ref 4.0–10.5)
nRBC: 0 % (ref 0.0–0.2)

## 2021-11-05 LAB — COMPREHENSIVE METABOLIC PANEL
ALT: 62 U/L — ABNORMAL HIGH (ref 0–44)
AST: 71 U/L — ABNORMAL HIGH (ref 15–41)
Albumin: 3.1 g/dL — ABNORMAL LOW (ref 3.5–5.0)
Alkaline Phosphatase: 39 U/L (ref 38–126)
Anion gap: 4 — ABNORMAL LOW (ref 5–15)
BUN: 9 mg/dL (ref 8–23)
CO2: 23 mmol/L (ref 22–32)
Calcium: 8.9 mg/dL (ref 8.9–10.3)
Chloride: 111 mmol/L (ref 98–111)
Creatinine, Ser: 0.76 mg/dL (ref 0.44–1.00)
GFR, Estimated: >60 mL/min (ref >60)
Glucose, Bld: 134 mg/dL — ABNORMAL HIGH (ref 70–99)
Potassium: 4.6 mmol/L (ref 3.5–5.1)
Sodium: 138 mmol/L (ref 135–145)
Total Bilirubin: 0.5 mg/dL (ref 0.3–1.2)
Total Protein: 6.3 g/dL — ABNORMAL LOW (ref 6.5–8.1)

## 2021-11-05 MED ORDER — ONDANSETRON HCL 4 MG PO TABS: 4.0000 mg | ORAL_TABLET | Freq: Three times a day (TID) | ORAL | 0 refills | Status: AC | PRN

## 2021-11-05 MED ORDER — PANTOPRAZOLE SODIUM 40 MG PO TBEC: 40.0000 mg | DELAYED_RELEASE_TABLET | Freq: Every day | ORAL | 0 refills | Status: AC

## 2021-11-05 MED ORDER — IOHEXOL 300 MG/ML  SOLN
150.0000 mL | Freq: Once | INTRAMUSCULAR | Status: AC | PRN
  Administered 2021-11-05: 50 mL via ORAL

## 2021-11-05 MED ORDER — BOOST / RESOURCE BREEZE PO LIQD CUSTOM
1.0000 | Freq: Two times a day (BID) | ORAL | Status: DC
Start: 2021-11-05 — End: 2021-11-05
  Administered 2021-11-05: 1 via ORAL

## 2021-11-05 MED ORDER — OXYCODONE HCL 5 MG PO TABS: 5.0000 mg | ORAL_TABLET | Freq: Four times a day (QID) | ORAL | 0 refills | Status: AC | PRN

## 2021-11-05 NOTE — Discharge Instructions (Signed)
EATING AFTER YOUR ESOPHAGEAL SURGERY ?(Stomach Fundoplication, Hiatal Hernia repair, Achalasia surgery, etc) ? ?###################################################################### ? ?EAT ?Start with a full liquid diet (see below) ?Gradually transition to a high fiber diet with a fiber supplement over the next month after discharge.   ? ?WALK ?Walk an hour a day.  Control your pain to do that.   ? ?CONTROL PAIN ?Control pain so that you can walk, sleep, tolerate sneezing/coughing, go up/down stairs. ? ?HAVE A BOWEL MOVEMENT DAILY ?Keep your bowels regular to avoid problems.  OK to try a laxative to override constipation.  OK to use an antidairrheal to slow down diarrhea.  Call if not better after 2 tries ? ?CALL IF YOU HAVE PROBLEMS/CONCERNS ?Call if you are still struggling despite following these instructions. ?Call if you have concerns not answered by these instructions ? ?###################################################################### ? ? ?After your esophageal surgery, expect some sticking with swallowing over the next 1-2 months.   ? ?If food sticks when you eat, it is called "dysphagia".  This is due to swelling around your esophagus at the wrap & hiatal diaphragm repair.  It will gradually ease off over the next few months.  To help you through this temporary phase, we start you out on a full liquid diet. ? ?Your first meal in the hospital was thin liquids.  You should have been given a full liquid diet by the time you left the hospital. Stay on clears and full liquids for the first week. Some patients may need to stay on a liquid diet for up to 2 weeks if having trouble swallowing.  Once tolerating that well, you can advance to pureed diet.   We ask patients to stay on a pureed diet for the 2nd-3rd week to avoid anything getting "stuck" near your recent surgery.  Don't be alarmed if your ability to swallow doesn't progress according to this plan.  Everyone is different and some diets can advance  more or less quickly.   ? ?It is often helpful to crush your medications or split them as they can sometimes stick, especially the first week or so. ? ? ?Some BASIC RULES to follow are: ?Maintain an upright position whenever eating or drinking. ?Take small bites - just a teaspoon size bite at a time. ?Eat slowly.  It may also help to eat only one food at a time. ?Consider nibbling through smaller, more frequent meals & avoid the urge to eat BIG meals ?Do not push through feelings of fullness, nausea, or bloatedness ?Do not mix solid foods and liquids in the same mouthful ?Try not to "wash foods down" with large gulps of liquids. ?Avoid carbonated (bubbly/fizzy) drinks.   ?Avoid foods that make you feel gassy or bloated.  Start with bland foods first.  Wait on trying greasy, fried, or spicy meals until you are tolerating more bland solids well. ?Understand that it will be hard to burp and belch at first.  This gradually improves with time.  Expect to be more gassy/flatulent/bloated initially.  Walking will help your body manage it better. ?Consider using medications for bloating that contain simethicone such as  Maalox or Gas-X  ?Consider crushing her medications, especially smaller pills.  The ability to swallow pills should get easier after a few weeks ?Eat in a relaxed atmosphere & minimize distractions. ?Avoid talking while eating.   ?Do not use straws. ?Following each meal, sit in an upright position (90 degree angle) for 60 to 90 minutes.  Going for a short walk can  help as well ?If food does stick, don't panic.  Try to relax and let the food pass on its own.  Sipping WARM LIQUID such as strong hot black tea can also help slide it down. ? ? ?Be gradual in changes & use common sense: ? ?-If you easily tolerating a certain "level" of foods, advance to the next level gradually ?-If you are having trouble swallowing a particular food, then avoid it.   ?-If food is sticking when you advance your diet, go back to  thinner previous diet (the lower LEVEL) for 1-2 days. ? ?LEVEL 2 = PUREED DIET ? ?Start 1- 2 WEEKS AFTER SURGERY IF YOU ARE TOLERATING A FULL LIQUID DIET EASILY ? ?-Foods in this group are pureed or blenderized to a smooth, mashed potato-like consistency.  ?-If necessary, the pureed foods can keep their shape with the addition of a thickening agent.   ?-Meat should be pureed to a smooth, pasty consistency.  Hot broth or gravy may be added to the pureed meat, approximately 1 oz. of liquid per 3 oz. serving of meat. ?-CAUTION:  If any foods do not puree into a smooth consistency, swallowing will be more difficult.  (For example, nuts or seeds sometimes do not blend well.) ? ?Hot Foods Cold Foods  ?Pureed scrambled eggs and cheese Pureed cottage cheese  ?Baby cereals Thickened juices and nectars  ?Thinned cooked cereals (no lumps) Thickened milk or eggnog  ?Pureed [place] toast or pancakes Ensure  ?Mashed potatoes Ice cream  ?Pureed parsley, au gratin, scalloped potatoes, candied sweet potatoes Fruit or [place] ice, sherbet  ?Pureed buttered or alfredo noodles Plain yogurt  ?Pureed vegetables (no corn or peas) Instant breakfast  ?Pureed soups and creamed soups Smooth pudding, mousse, custard  ?Pureed scalloped apples Whipped gelatin  ?Gravies Sugar, syrup, honey, jelly  ?Sauces, cheese, tomato, barbecue, white, creamed Cream  ?Any baby food Creamer  ?Alcohol in moderation (not beer or champagne) Margarine  ?Coffee or tea Mayonnaise  ? Ketchup, mustard  ? Apple sauce  ? ?SAMPLE MENU:  PUREED DIET ?Breakfast Lunch Dinner  ?Orange juice, 1/2 cup ?Cream of wheat, 1/2 cup Pineapple juice, 1/2 cup Pureed [place], barley soup, 3/4 cup ?Pureed Hawaiian chicken, 3 oz  ?Scrambled eggs, mashed or blended with cheese, 1/2 cup ?Tea or coffee, 1 cup  ?Whole milk, 1 cup  ?Non-dairy creamer, 2 Tbsp. Mashed potatoes, 1/2 cup ?Pureed cooled broccoli, 1/2 cup ?Apple sauce, 1/2 cup ?Coffee or tea Mashed potatoes, 1/2 cup ?Pureed spinach,  1/2 cup ?Frozen yogurt, 1/2 cup ?Tea or coffee  ? ? ? ? ?LEVEL 3 = SOFT DIET ? ?After your first 4 weeks, you can advance to a soft diet.   ?Keep on this diet until everything goes down easily. ? ?Hot Foods Cold Foods  ?White fish [place]  ?Stuffed fish Junior baby fruit  ?Baby food meals Semi thickened juices  ?Minced soft cooked, scrambled, poached eggs nectars  ?Souffle & omelets Ripe mashed bananas  ?Cooked cereals Canned fruit, pineapple sauce, milk  ?potatoes Milkshake  ?Buttered or Alfredo noodles Custard  ?Cooked cooled vegetable Puddings, including tapioca  ?Sherbet Yogurt  ?Vegetable soup or alphabet soup Fruit ice, [place] ice  ?Gravies Whipped gelatin  ?Sugar, syrup, honey, jelly Junior baby desserts  ?Sauces:  Cheese, creamed, barbecue, tomato, white Cream  ?Coffee or tea Margarine  ? ?SAMPLE MENU:  LEVEL 3 ?Breakfast Lunch Dinner  ?Orange juice, 1/2 cup ?Oatmeal, 1/2 cup ?Scrambled eggs with cheese, 1/2 cup ?Decaffeinated tea,  1 cup ?Whole milk, 1 cup ?Non-dairy creamer, 2 Tbsp Pineapple juice, 1/2 cup ?Minced beef, 3 oz ?Gravy, 2 Tbsp ?Mashed potatoes, 1/2 cup ?Minced fresh broccoli, 1/2 cup ?Applesauce, 1/2 cup ?Coffee, 1 cup [place], barley soup, 3/4 cup ?Minced Hawaiian chicken, 3 oz ?Mashed potatoes, 1/2 cup ?Cooked spinach, 1/2 cup ?Frozen yogurt, 1/2 cup ?Non-dairy creamer, 2 Tbsp  ? ? ? ? ?LEVEL 4 = CHOPPED DIET ? ?-After all the foods in level 3 (soft diet) are passing through well you should advance up to more chopped foods.  ?-It is still important to cut these foods into small pieces and eat slowly. ? ?Hot Foods Cold Foods  ?[employer]  ?Chopped Swedish meatballs Yogurt  ?Meat salads (ground or flaked meat) Milk  ?Flaked fish (tuna) Milkshakes  ?Poached or scrambled eggs Soft, cold, dry cereal  ?Souffles and omelets Fruit juices or nectars  ?Cooked cereals Chopped canned fruit  ?Chopped [place] toast or pancakes Canned fruit cocktail  ?Noodles or pasta (no rice) Pudding,  mousse, custard  ?Cooked vegetables (no frozen peas, corn, or mixed vegetables) Green salad  ?Canned small sweet peas Ice cream  ?Creamed soup or vegetable soup Fruit ice, [place] ice  ?Pureed vegetable soup o

## 2021-11-05 NOTE — Plan of Care (Addendum)
Instructions were reviewed with patient. All questions were answered. Patient was transported to main entrance by wheelchair. ° °

## 2021-11-05 NOTE — Op Note (Signed)
NAME: Lori Cummings, Lori Cummings MEDICAL RECORD NO: [id number] ACCOUNT NO: [id number] DATE OF BIRTH: Jul 02, 1955 FACILITY: [facility] LOCATION: [id number] PHYSICIAN: Ashyia Sella. Andrey Campanile, MD  Operative Report   DATE OF PROCEDURE: 11/04/2021  PREOPERATIVE DIAGNOSIS:  Paraesophageal hernia.  POSTOPERATIVE DIAGNOSIS:  Paraesophageal hernia.  PROCEDURE: 1.  Xi robotic-assisted paraesophageal hernia repair with absorbable mesh with partial fundoplication. 2.  Upper endoscopy. 3.  Laparoscopic bilateral TAP block.  SURGEON:  Gaynelle Adu, MD  ASSISTANT SURGEON:  Luretha Murphy - an assistant surgeon was requested due to the complexity of the anatomy and need to help with identification of the anatomy and manipulation and retraction of tissues.  ANESTHESIA:  General.  ESTIMATED BLOOD LOSS:  25 mL.  DRAINS:  None.  SPECIMENS:  Hernia sac, which was discarded.  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  She had been having chest discomfort when she was eating.  She also has left upper quadrant discomfort.  She does not really  have any heartburn per se.  She cannot tolerate large portions.  She has to be very mindful when she eats.  Otherwise, she will regurgitate.  She has had an upper endoscopy and a CT scan, which demonstrated a large paraesophageal hernia.  DESCRIPTION OF PROCEDURE:  After obtaining informed consent, the patient was taken to OR #2 at [facility] and placed supine on the operating room table.  General endotracheal anesthesia was established.  Sequential compression devices were  placed.  A Foley catheter was placed.  Her abdomen was prepped and draped in the usual standard surgical fashion with ChloraPrep.  She received IV antibiotic prior to skin incision.  A surgical timeout was performed.  Access to the abdomen was gained with optical entry.  I made an incision about 8 cm lateral to the umbilicus.   Then, using a 0-degree 5 mm laparoscope, I advanced it through all layers of the abdominal wall; however, I felt some resistance as I was  trying to go through the peritoneum and my initial intraabdominal pressure reading was high when connecting the insufflation tubing.  There was not a lot of give so therefore, I decided to go at Palmer's point.  So, I made a small incision just 2  fingerbreadths below Palmer's point in the left upper quadrant.  Then, using a 0-degree 5 mm laparoscope through a 5 mm trocar advanced it through all layers of the abdominal wall and carefully entered the abdominal cavity.  Pneumoperitoneum was  established to a patient pressure of 15 mmHg.  The laparoscope was advanced and the abdominal cavity was surveyed.  I had not come through the peritoneum in the lower incision.  There was just some insufflation done in the preperitoneal space.  The  patient was placed in reverse Trendelenburg to about 23 degrees.  I placed an 8 mm robotic trocar about 8 cm lateral to the umbilicus, another 8 mm trocar slightly above and to the left of the umbilicus and another 8 mm trocar to the right of the  umbilicus about 8 cm.  An assistant 12 mm trocar was placed in the right lateral upper abdominal wall under direct visualization.  The robotic endoscope was obtained and a final robotic 8 mm trocar was placed in the left lateral abdominal wall and  through the mid axillary line.  We then went about performing a bilateral Exparel TAP block for postoperative pain relief using laparoscopic guidance to ensure  that we were putting local in correct tissue plane.  A liver retractor was placed through a  small subxiphoid incision to lift up the left lobe of the liver.  This exposed the hiatus.  She had a rather large defect, probably of about 5-6 cm.  Fair amount of stomach was herniated into the mediastinum.  At this point, we docked the surgical robot.   Camera was attached to arm 2, fenestrated bipolar  in arm 1, vessel sealer in arm 3, and a tip-up grasper in arm 4.  I then went to the surgeon console while my assistant stayed at the bedside.  Gastrohepatic ligament was incised with vessel sealer.  The right crus of the diaphragm was identified.  I identified the edge of the hernia sac along the right edge of the right crura and that was incised with the vessel sealer.  I then started bluntly  dissecting the sac up out of the mediastinum back into the abdomen using a combination of blunt dissection with the vessel sealer and taking down attachments where indicated.  I continued it across anteriorly.  I identified the left crura edge and again  incised the hernia sac on the left and then continued it across up anteriorly to where I had previously mobilized it.  Arm 4 was retracting on the hernia sac.  I was able to gently mobilize the hernia sac down at the mediastinum anteriorly and on the  left and right side.  We then turned our attention posteriorly.  I identified the aorta.  I identified the posterior vagus.  Not all the stomach was out of the mediastinum at this point.  There was a small amount still left.  At this point, I decided to  take down some short gastrics along the greater curvature of the stomach. Tip-up grasper was used to hold the greater curvature of omentum and then vessel sealer was used to sort of incised the short gastrics and get into the lesser sac and this was  started about the mid body of the stomach and then carried sequentially all the way up to the left crus of the diaphragm.  This allowed me to further identify the edge of the left crus of the diaphragm.  At this point, I was able to pass an instrument  retrogastric and come from the confluence of the left and right crura to the left upper quadrant.  A Penrose drain was placed in the abdomen by the assistant and we wrapped it around the upper stomach and used this as a fulcrum to help with retraction of  the stomach and  distal esophagus.  My assistant held this, so I continued to work up in the mediastinum.  I then completed a circumferential mobilization of the esophagus up into the mediastinum.  I identified the anterior vagus nerve.  The esophagus  was sort of gently bluntly lifted up off the aorta by taking down the avascular tissues with vessel sealer.  We did have anesthesia pass a 54-French bougie down just to several times to confirm the location and extent of the esophagus since it did appear  a little bit splayed.  We did this before I started the mediastinal circumferential mobilization.  I continued the mobilization up to about 8 cm up into the mediastinum.  At this time, it appeared we had achieved enough intraabdominal esophageal length.   I obtained, the Olympus endoscope and put into the patient's oropharynx and gently glided it down her esophagus.  The proximal and mid esophagus appeared normal.  The distal esophagus also appeared normal.  There was no evidence of any injury  intraluminally.  We transilluminated the endoscope and then looked at the laparoscopic robotic monitor and visualized that the transillumination was approximately about 3 cm within the abdominal cavity.  The transillumination was done at the Z-line.   Therefore, I felt I had achieved enough esophageal mobilization.  The stomach was desufflated and the endoscope was removed.  I took down one of the lateral attachments of the left lobe of the liver to the diaphragm with the vessel sealer.  I also  cleaned up the left crus of the diaphragm and freed an area for the mesh to lay.  Her right diaphragm looked a little bit thin and therefore, I decided I was going to reinforce the diaphragmatic repair with mesh.  At this point, I trimmed the hernia sac  and debulked the hernia sac from the upper stomach using vessel sealer ensuring that I was not on the stomach or on the esophagus.  The hernia sac was removed from the abdomen by the assistant.   The assistant then introduced several 0 Ethibond sutures.   Pneumoperitoneum was decreased to 10 mmHg.  I then closed the diaphragm with 4 interrupted 0 Ethibond sutures.  We had anesthesia passed the 54-French bougie back down and it slid through the diaphragmatic closure okay.  There was still a little bit of a  small gap, so therefore, I did not place any additional sutures in the diaphragm.  We then obtained a piece of [employer] hiatal hernia graft preshaped.  We slightly trimmed the edges.  It was fenestrated around in 2 locations for about 0.5 cm in  the open U shaped part of the mesh.  The assistant then placed in the abdomen.  The mesh was then placed over the cruroplasty.  The opening up the mesh was oriented slightly at the 11 o'clock position where the mesh was covering most of the anterior  opening and cruroplasty repair itself.  The opening was facing sort of the 11 o'clock position.  The mesh was then secured to the cruroplasty with a single interrupted 0 Ethibond suture.  One corner of the mesh was then placed along the right crus of the  diaphragm and another 0 Ethibond was placed along the left apex of the mesh along the left diaphragm.  Because of her age and the fact that I put in a piece of mesh, I decided to only do a partial wrap/Toupet wrap.  I confirmed we had taken down enough  short gastrics on the greater curve of the stomach.  Then, I passed a robotic interim instrument retrogastric and grasped the fundus of the stomach and then brought it retrogastric back to the patient's right side of the abdomen.  Then, grabbing distal  fundus, I performed a shoeshine maneuver.  It easily confirmed the correct location.  It was not under tension.  It initially stayed in position when I let go of it.  I then did a typical Toupet in standard fashion.  Three sutures were placed from the  anterior side of the esophagus to the fundus on the right side of the abdomen and in a similar fashion  from the distal fundus to the left anterior esophagus, so essentially the esophagus looked like a hot dog in a bun.  I then obtained the Olympus  endoscope and placed it back down in the patient's oropharynx and  gently glided it down the esophagus.  Again, there was no evidence of injury to the esophagus.  I advanced the scope into the gastric body.  The stomach was insufflated.  I then  retroflexed and visualized the hiatus.  There appeared to be a 180-degree wrap intact and no evidence of a hiatal hernia.  Endoscopy picture was obtained.  Scope was detorsed and the stomach was desufflated.  Endoscope was removed.  I then scrubbed back  in.  We undocked the surgical robot. [facility] liver retractor was removed.  The 12 mm assistant trocar was closed with a 0 Vicryl using a PMI suture passer with laparoscopic guidance to close the fascial defect.  Local was infiltrated in that  location.  Remaining trocars were removed and the skin incisions were closed with a 4-0 Monocryl followed by the application of Dermabond.  All needle, instrument and sponge counts were correct x2.  There were no immediate complications.  The patient  tolerated the procedure well.   NIK D: 11/04/2021 11:36:27 am T: 11/05/2021 2:26:00 am  JOB: [id number]/ [id number]

## 2021-11-05 NOTE — Progress Notes (Signed)
?  Transition of Care (TOC) Screening Note ? ? ?Patient Details  ?Name: Lori Cummings ?Date of Birth: Dec 17, 1955 ? ? ?Transition of Care (TOC) CM/SW Contact:    ?Hanni Milford, LCSW ?Phone Number: ?11/05/2021, 12:21 PM ? ? ? ?Transition of Care Department [facility]) has reviewed patient and no TOC needs have been identified at this time. We will continue to monitor patient advancement through interdisciplinary progression rounds. If new patient transition needs arise, please place a TOC consult. ? ? ?

## 2021-11-05 NOTE — Discharge Summary (Signed)
Physician Discharge Summary  ?CING [place]wan JSE:[id number] DOB: May 27, 1956 DOA: 11/04/2021 ? ?PCP: Sueanne Margarita, DO ? ?Admit date: 11/04/2021 ?Discharge date: 11/05/2021 ? ?Recommendations for Outpatient Follow-up:  ? ? ? Follow-up Information   ? ? Greer Pickerel, MD. Schedule an appointment as soon as possible for a visit in 3 week(s).   ?Specialty: General Surgery ?Why: For wound re-check ?Contact information: ?[address] ?[address] ?[place] [address] ?[phone] ? ? ?  ?  ? ?  ?  ? ?  ? ?Discharge Diagnoses:  ?Paraesophageal hernia s/p repair ? ?Surgical Procedure: 1.  Xi robotic-assisted paraesophageal hernia repair with absorbable mesh with partial fundoplication. ?2.  Upper endoscopy. ?3.  Laparoscopic bilateral TAP block. ? ?Discharge Condition: good ?Disposition: home ? ?Diet recommendation: full liquid ? ?Filed Weights  ? 11/04/21 0532  ?Weight: 74.3 kg  ? ? 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  She had been having chest discomfort when she was eating.  She also has left upper quadrant discomfort.  She does not really ? have any heartburn per se.  She cannot tolerate large portions.  She has to be very mindful when she eats.  Otherwise, she will regurgitate.  She has had an upper endoscopy and a CT scan, which demonstrated a large paraesophageal hernia. ? ?Hospital Course:  ?She was taken to the OR for the procedure. She was kept overnight for observation. She was maintained on perioperative vte prophylaxis. She was started on clears on POD 0. On pod 1 a UGI was performed and showed no leak, obstruction and satifactory repair. She was advanced to full liquids which she tolerated. She had trouble with certain pills but liquids went down fine. No nausea/burping/belching. Ambulating well. Pain controlled. Discussed dc instructions and felt stable for discharge ? ?BP 120/79 (BP Location: Left Arm)   Pulse 71   Temp  98 ?F (36.7 ?C) (Oral)   Resp 15   Ht '5\' 3"'$  (1.6 m)   Wt 74.3 kg   SpO2 100%   BMI 29.02 kg/m?  ? ?Gen: alert, NAD, non-toxic appearing ?Pupils: equal, no scleral icterus ?Pulm: Lungs clear to auscultation, symmetric chest rise ?CV: regular rate and rhythm ?Abd: soft, mild approp TTP. No cellulitis. No incisional hernia ?Ext: no edema, no calf tenderness ?Skin: no rash, no jaundice ? ? ? ?Discharge Instructions ? ?Discharge Instructions   ? ? Call MD for:   Complete by: As directed ?  ? Temperature >101  ? Call MD for:  hives   Complete by: As directed ?  ? Call MD for:  persistant dizziness or light-headedness   Complete by: As directed ?  ? Call MD for:  persistant nausea and vomiting   Complete by: As directed ?  ? Call MD for:  redness, tenderness, or signs of infection (pain, swelling, redness, odor or green/yellow discharge around incision site)   Complete by: As directed ?  ? Call MD for:  severe uncontrolled pain   Complete by: As directed ?  ? Diet full liquid   Complete by: As directed ?  ? Discharge instructions   Complete by: As directed ?  ? See [facility] discharge instructions  ? Increase activity slowly   Complete by: As directed ?  ? ?  ? ?Allergies as of 11/05/2021   ? ?   Reactions  ? Amoxicillin Itching, Rash  ? Penicillins Itching, Rash  ? ?  ? ?  ?  Medication List  ?  ? ?STOP taking these medications   ? ?ibuprofen 200 MG tablet ?Commonly known as: ADVIL ?  ? ?  ? ?TAKE these medications   ? ?ALLERGY EYE DROPS OP ?Place 1 drop into both eyes daily as needed (itchy eyes). ?  ?magnesium gluconate 500 MG tablet ?Commonly known as: MAGONATE ?Take 1,500 mg by mouth daily. ?  ?ondansetron 4 MG tablet ?Commonly known as: Zofran ?Take 1 tablet (4 mg total) by mouth every 8 (eight) hours as needed for nausea or vomiting. ?  ?oxyCODONE 5 MG immediate release tablet ?Commonly known as: Oxy IR/ROXICODONE ?Take 1 tablet (5 mg total) by mouth every 6 (six) hours as needed for severe pain. ?  ?pantoprazole 40 MG  tablet ?Commonly known as: Protonix ?Take 1 tablet (40 mg total) by mouth daily. ?  ?polyethylene glycol 17 g packet ?Commonly known as: MIRALAX / GLYCOLAX ?Take 17 g by mouth 2 (two) times daily. ?  ?Vitamin D3 125 MCG (5000 UT) Caps ?Take 15,000 Units by mouth daily. ?  ?VITAMIN E PO ?Take 1 capsule by mouth daily. ?  ?ZINC PO ?Take 1 tablet by mouth every other day. ?  ? ?  ? ? Follow-up Information   ? ? Greer Pickerel, MD. Schedule an appointment as soon as possible for a visit in 3 week(s).   ?Specialty: General Surgery ?Why: For wound re-check ?Contact information: ?[address] ?[address] ?[place] [address] ?[phone] ? ? ?  ?  ? ?  ?  ? ?  ? ? ? ?The results of significant diagnostics from this hospitalization (including imaging, microbiology, ancillary and laboratory) are listed below for reference.   ? ?Significant Diagnostic Studies: ?DG UGI W SINGLE CM (SOL OR THIN BA) ? ?Result Date: 11/05/2021 ?CLINICAL DATA:  Provided history: Postop day 1 status post paraesophageal hernia repair with partial gastric wrap. EXAM: DG UGI W SINGLE CM TECHNIQUE: Scout radiographs of the abdomen were obtained. Subsequently, a problem-oriented upper GI series was performed to assess for leak or obstruction using soluble contrast. This exam was performed by Pasty Spillers, PA-C, and was supervised and interpreted by Kellie Simmering, DO. FLUOROSCOPY TIME:  Flouroscopy time: 1 minute, 12 seconds. Radiation Exposure Index (as provided by the fluoroscopic device): 19.30 mGy COMPARISON:  Chest CT 06/27/2021. FINDINGS: Scout radiographs of the abdomen were obtained. Nonobstructive bowel gas pattern. Degenerative changes of the lower lumbar spine. Subsequently, a problem-oriented upper GI series was performed to assess for leak or obstruction status post paraesophageal hernia repair and partial gastric wrap. Water-soluble contrast was used. Prompt passage of contrast from the distal esophagus into the stomach. Expected  post-operative appearance of the distal esophagus/GE junction. No extraluminal contrast identified to suggest leak. 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 IMPRESSION: No evidence of leak or obstruction status post paraesophageal hernia repair and partial gastric wrap. 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 Electronically Signed   By: Kellie Simmering D.O.   On: 11/05/2021 10:32   ? ?Microbiology: ?No results found for this or any previous visit (from the past 240 hour(s)).  ? ?Labs: ?Basic Metabolic Panel: ?Recent Labs  ?Lab 11/04/21 ?1311 11/05/21 ?0432  ?NA  --  138  ?K  --  4.6  ?CL  --  111  ?CO2  --  23  ?GLUCOSE  --  134*  ?BUN  --  9  ?CREATININE 0.78 0.76  ?CALCIUM  --  8.9  ?MG  --  2.0  ? ?  Liver Function Tests: ?Recent Labs  ?Lab 11/05/21 ?3559  ?AST 71*  ?ALT 62*  ?ALKPHOS 39  ?BILITOT 0.5  ?PROT 6.3*  ?ALBUMIN 3.1*  ? ?No results for input(s): LIPASE, AMYLASE in the last 168 hours. ?No results for input(s): AMMONIA in the last 168 hours. ?CBC: ?Recent Labs  ?Lab 11/05/21 ?7416  ?WBC 6.7  ?HGB 11.3*  ?HCT 34.4*  ?MCV 94.8  ?PLT 238  ? ?Cardiac Enzymes: ?No results for input(s): CKTOTAL, CKMB, CKMBINDEX, TROPONINI in the last 168 hours. ?BNP: ?BNP (last 3 results) ?No results for input(s): BNP in the last 8760 hours. ? ?ProBNP (last 3 results) ?No results for input(s): PROBNP in the last 8760 hours. ? ?CBG: ?No results for input(s): GLUCAP in the last 168 hours. ? ?Principal Problem: ?  History of repair of hiatal hernia ? ? ?Time coordinating discharge: 15 min ? ?Signed: ? ?Gayland Curry, MD FACS ?[facility], [place] ?[phone] ?11/05/2021, 3:23 PM ? ? ?

## 2022-03-04 ENCOUNTER — Ambulatory Visit
Admission: RE | Admit: 2022-03-04 | Discharge: 2022-03-04 | Disposition: A | Discharge status: Home / Self Care | Payer: Medicare Other | Source: Ambulatory Visit | Attending: Internal Medicine | Admitting: Internal Medicine

## 2022-03-04 DIAGNOSIS — Z1231 Encounter for screening mammogram for malignant neoplasm of breast: Secondary | ICD-10-CM

## 2022-04-04 ENCOUNTER — Other Ambulatory Visit: Payer: Self-pay | Admitting: Internal Medicine

## 2022-04-04 DIAGNOSIS — R911 Solitary pulmonary nodule: Secondary | ICD-10-CM

## 2022-04-08 ENCOUNTER — Ambulatory Visit
Admission: RE | Admit: 2022-04-08 | Discharge: 2022-04-08 | Disposition: A | Discharge status: Home / Self Care | Payer: Medicare Other | Source: Ambulatory Visit | Attending: Internal Medicine | Admitting: Internal Medicine

## 2022-04-08 DIAGNOSIS — R911 Solitary pulmonary nodule: Secondary | ICD-10-CM

## 2022-04-08 MED ORDER — IOPAMIDOL (ISOVUE-300) INJECTION 61%
75.0000 mL | Freq: Once | INTRAVENOUS | Status: AC | PRN
  Administered 2022-04-08: 75 mL via INTRAVENOUS

## 2022-06-02 ENCOUNTER — Other Ambulatory Visit: Payer: Medicare Other

## 2022-06-02 ENCOUNTER — Ambulatory Visit
Admission: RE | Admit: 2022-06-02 | Discharge: 2022-06-02 | Disposition: A | Discharge status: Home / Self Care | Payer: Medicare Other | Source: Ambulatory Visit | Attending: Internal Medicine | Admitting: Internal Medicine

## 2022-06-02 DIAGNOSIS — E041 Nontoxic single thyroid nodule: Secondary | ICD-10-CM

## 2022-07-04 ENCOUNTER — Other Ambulatory Visit: Payer: Self-pay | Admitting: Internal Medicine

## 2022-07-04 DIAGNOSIS — E041 Nontoxic single thyroid nodule: Secondary | ICD-10-CM

## 2022-08-12 ENCOUNTER — Other Ambulatory Visit: Payer: Medicare Other

## 2022-08-12 ENCOUNTER — Other Ambulatory Visit (HOSPITAL_COMMUNITY)
Admission: RE | Admit: 2022-08-12 | Discharge: 2022-08-12 | Disposition: A | Discharge status: Home / Self Care | Payer: 59 | Source: Ambulatory Visit | Attending: Internal Medicine | Admitting: Internal Medicine

## 2022-08-12 ENCOUNTER — Ambulatory Visit
Admission: RE | Admit: 2022-08-12 | Discharge: 2022-08-12 | Disposition: A | Discharge status: Home / Self Care | Payer: 59 | Source: Ambulatory Visit | Attending: Internal Medicine | Admitting: Internal Medicine

## 2022-08-12 DIAGNOSIS — E041 Nontoxic single thyroid nodule: Secondary | ICD-10-CM | POA: Insufficient documentation

## 2022-08-14 LAB — CYTOLOGY - NON PAP

## 2022-10-08 ENCOUNTER — Other Ambulatory Visit: Payer: Self-pay | Admitting: Internal Medicine

## 2022-10-08 DIAGNOSIS — E041 Nontoxic single thyroid nodule: Secondary | ICD-10-CM

## 2022-10-08 DIAGNOSIS — N1831 Chronic kidney disease, stage 3a: Secondary | ICD-10-CM

## 2022-10-10 ENCOUNTER — Encounter: Payer: Self-pay | Admitting: Internal Medicine

## 2022-10-31 ENCOUNTER — Ambulatory Visit
Admission: RE | Admit: 2022-10-31 | Discharge: 2022-10-31 | Disposition: A | Discharge status: Home / Self Care | Payer: 59 | Source: Ambulatory Visit | Attending: Internal Medicine | Admitting: Internal Medicine

## 2022-10-31 DIAGNOSIS — E041 Nontoxic single thyroid nodule: Secondary | ICD-10-CM

## 2022-11-02 IMAGING — RF DG UGI W SINGLE CM
9 of 10 series · 14 of 24 positions shown · non-contrast
Comparison: Chest CT 06/27/2021.

CLINICAL DATA: Provided history: Postop day 1 status post
paraesophageal hernia repair with partial gastric wrap.

EXAM:
DG UGI W SINGLE CM
TECHNIQUE: Scout radiographs of the abdomen were obtained. Subsequently, a
problem-oriented upper GI series was performed to assess for leak or
obstruction using soluble contrast. This exam was performed by
Jumper, [place], and was supervised and interpreted by Firmina
Thanh, DO.
FLUOROSCOPY TIME:  Flouroscopy time: 1 minute, 12 seconds.
Radiation Exposure Index (as provided by the fluoroscopic device):
19.30 mGy

[Series 1: t abdomen · 1 of 1 slices shown]
[im 1/1]
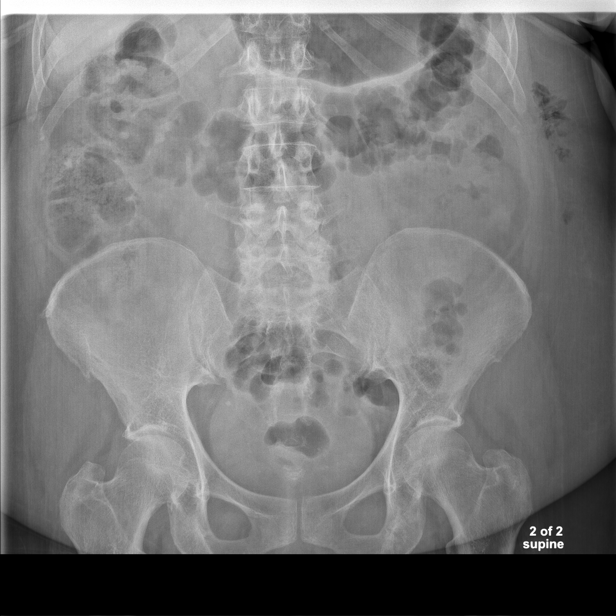

[Series 3: fluoro_barium singleshot_bw · 1 of 1 slices shown (1 of 2)]
[im 1/1]
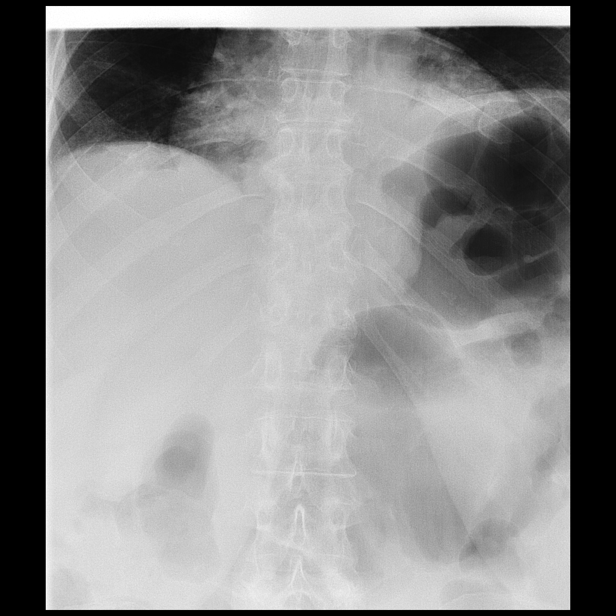

[Series 4: cp_standard · 1 of 94 frames shown (1 of 6)]
[frame 48/94]
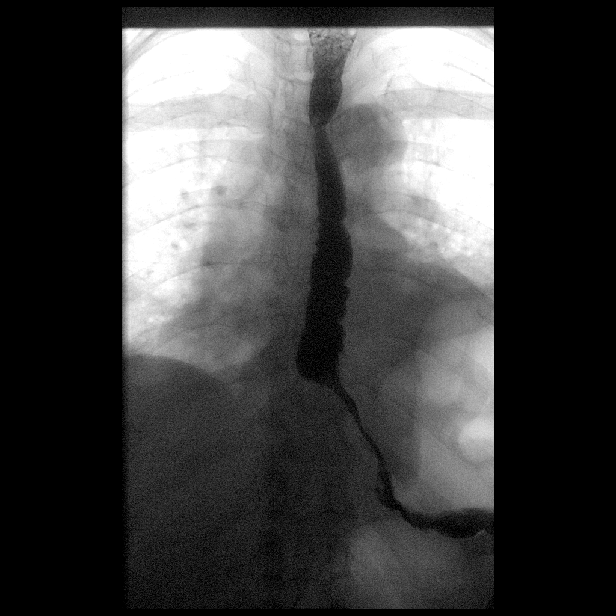

[Series 5: cp_standard · 2 of 115 frames shown (2 of 6)]
[frame 18/115]
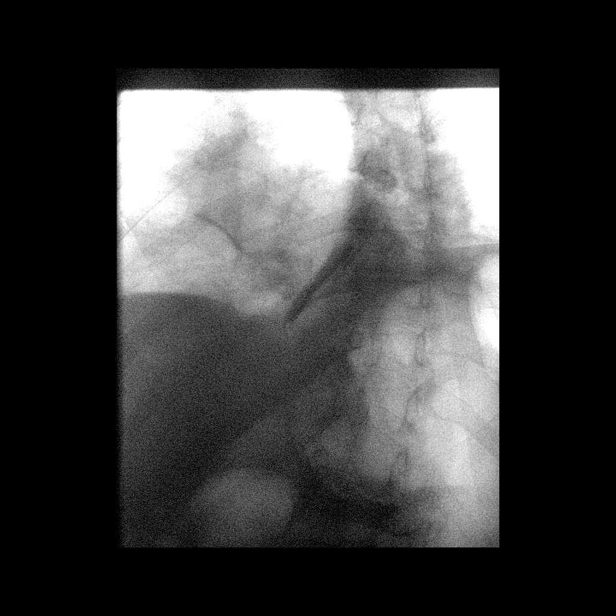
[frame 58/115]
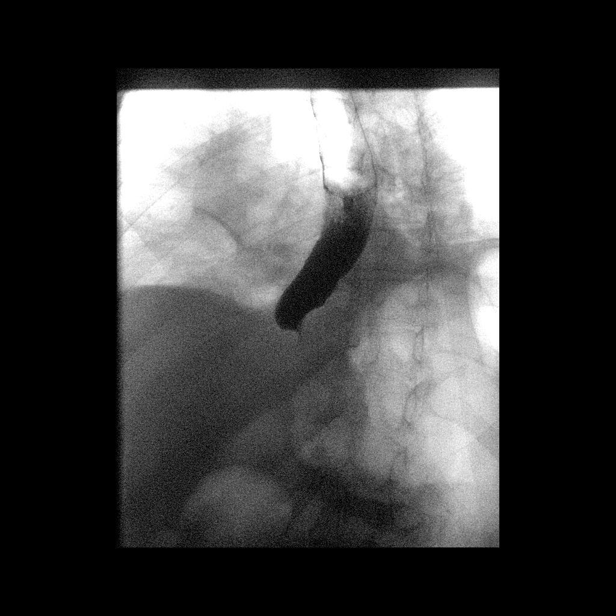

[Series 6: cp_standard · 3 of 273 frames shown (3 of 6)]
[frame 41/273]
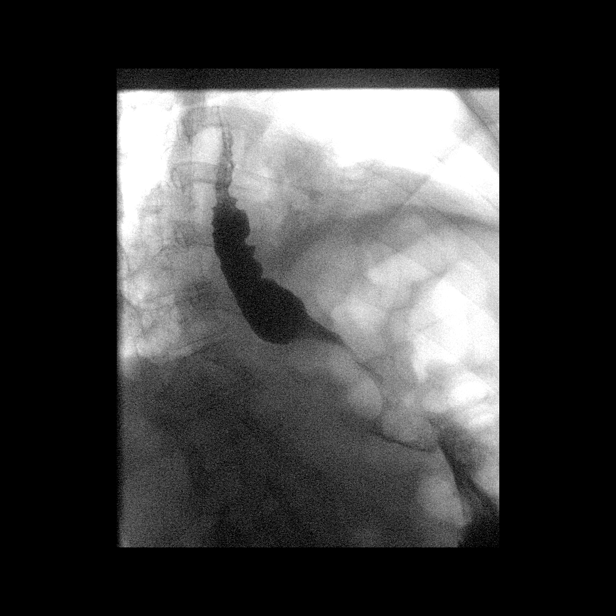
[frame 137/273]
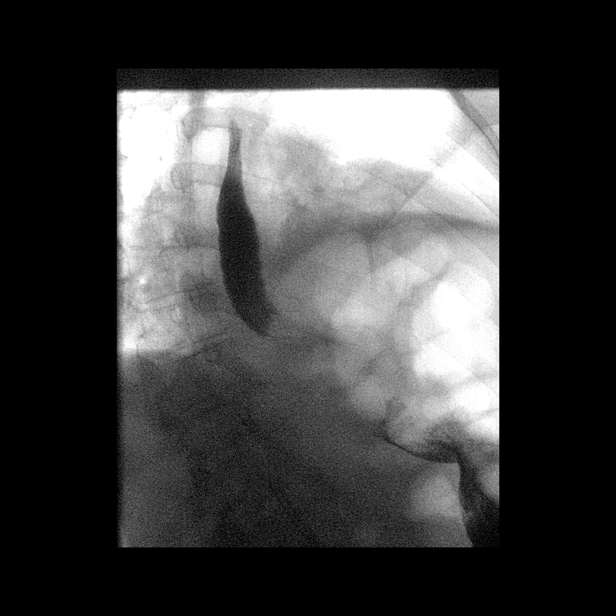
[frame 233/273]
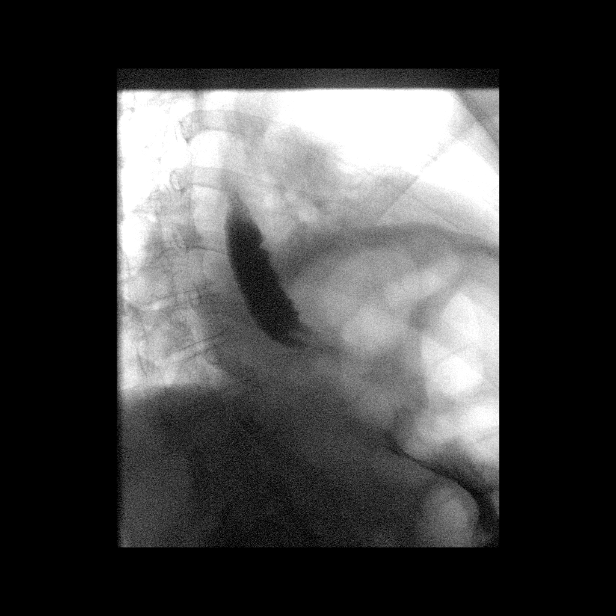

[Series 7: cp_standard · 1 of 76 frames shown (4 of 6)]
[frame 39/76]
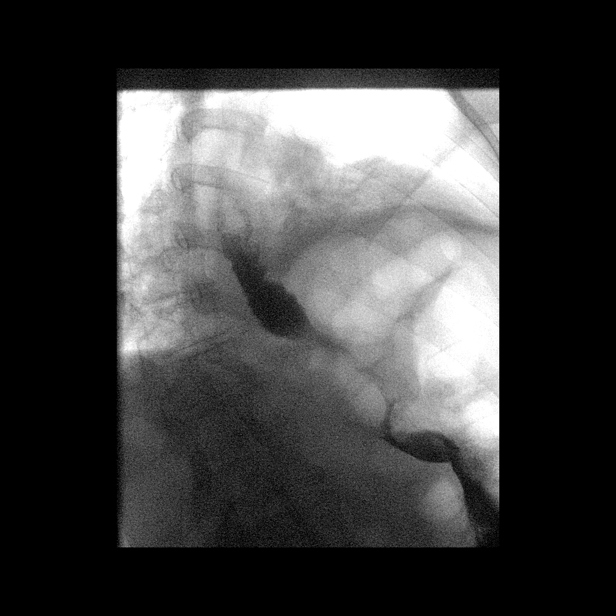

[Series 8: cp_standard · 3 of 59 frames shown (5 of 6)]
[frame 9/59]
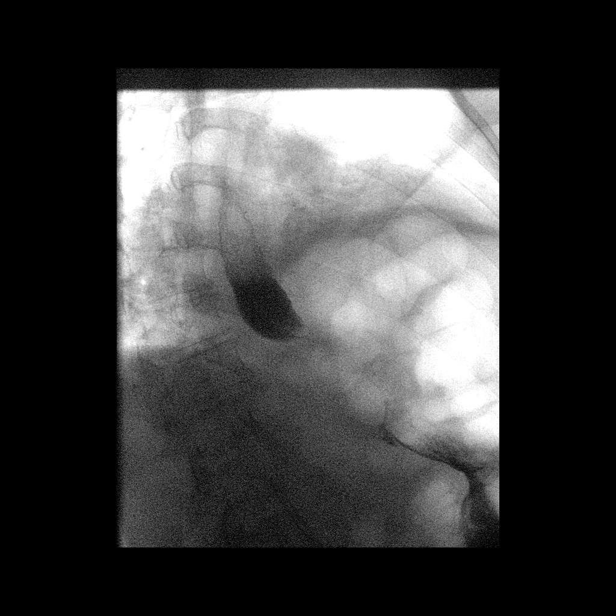
[frame 51/59]
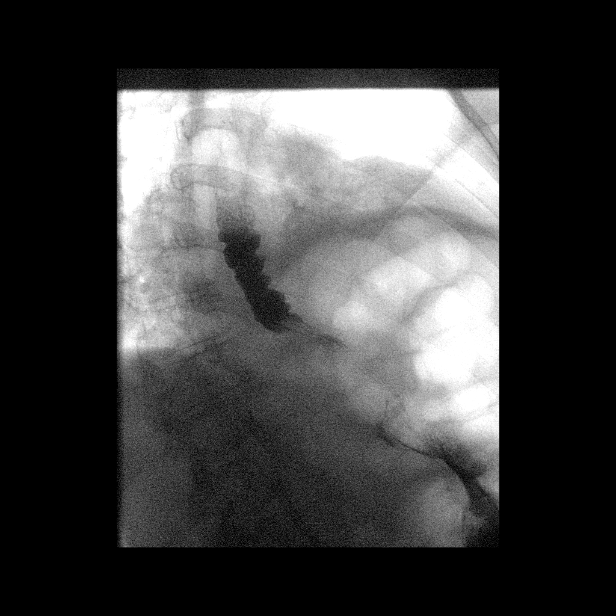
[frame 59/59]
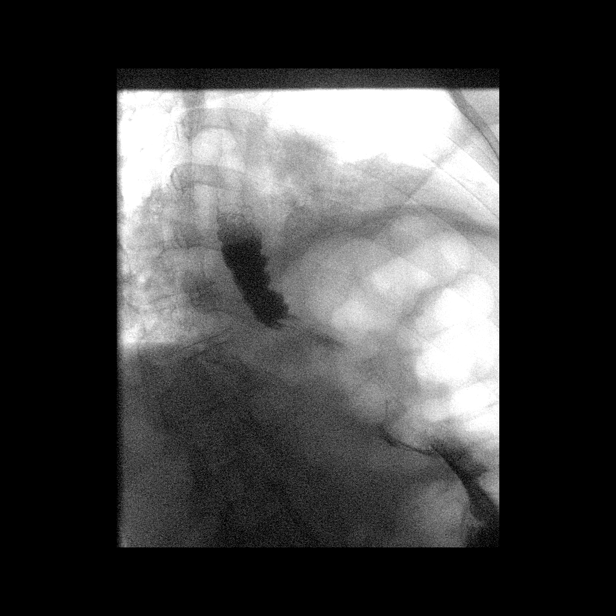

[Series 9: cp_standard · 1 of 114 frames shown (6 of 6)]
[frame 58/114]
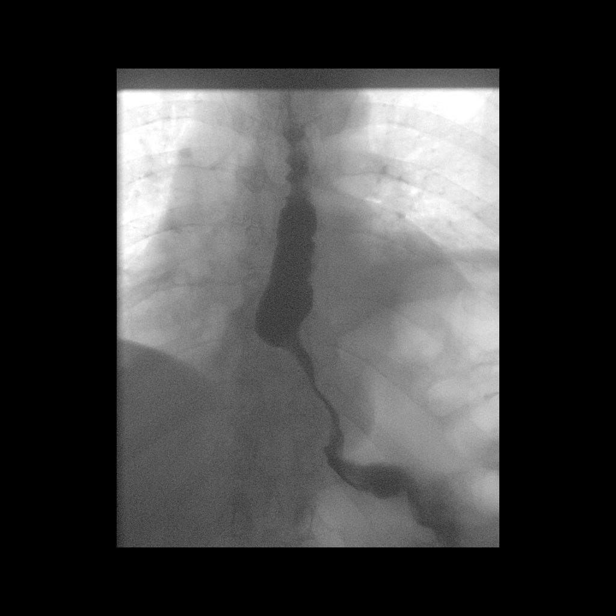

[Series 10: fluoro_barium singleshot_bw · 1 of 1 slices shown (2 of 2)]
[im 1/1]
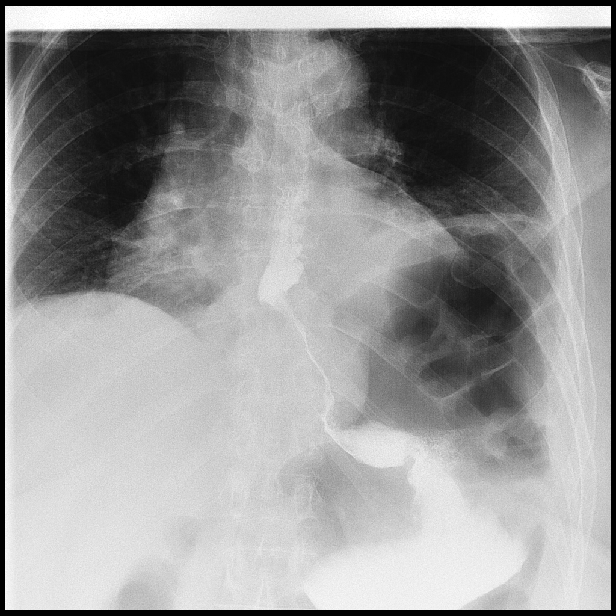

[14 of 24 positions shown; findings below may reference images not displayed]

FINDINGS: Scout radiographs of the abdomen were obtained. Nonobstructive bowel
gas pattern. Degenerative changes of the lower lumbar spine.

Subsequently, a problem-oriented upper GI series was performed to
assess for leak or obstruction status post paraesophageal hernia
repair and partial gastric wrap. Water-soluble contrast was used.
Prompt passage of contrast from the distal esophagus into the
stomach. Expected post-operative appearance of the distal
esophagus/GE junction. No extraluminal contrast identified to
suggest leak. Moderate intermittent esophageal dysmotility with
tertiary contractions.
IMPRESSION: No evidence of leak or obstruction status post paraesophageal hernia
repair and partial gastric wrap.

Moderate intermittent esophageal dysmotility with tertiary
contractions.

## 2023-02-17 ENCOUNTER — Other Ambulatory Visit: Payer: Self-pay | Admitting: Internal Medicine

## 2023-02-17 DIAGNOSIS — Z1231 Encounter for screening mammogram for malignant neoplasm of breast: Secondary | ICD-10-CM

## 2023-03-11 ENCOUNTER — Ambulatory Visit
Admission: RE | Admit: 2023-03-11 | Discharge: 2023-03-11 | Disposition: A | Discharge status: Home / Self Care | Payer: 59 | Source: Ambulatory Visit | Attending: Internal Medicine | Admitting: Internal Medicine

## 2023-03-11 DIAGNOSIS — Z1231 Encounter for screening mammogram for malignant neoplasm of breast: Secondary | ICD-10-CM

## 2023-10-07 ENCOUNTER — Other Ambulatory Visit: Payer: Self-pay | Admitting: Internal Medicine

## 2023-10-07 DIAGNOSIS — E041 Nontoxic single thyroid nodule: Secondary | ICD-10-CM

## 2023-10-13 ENCOUNTER — Inpatient Hospital Stay: Admission: RE | Admit: 2023-10-13 | Source: Ambulatory Visit

## 2023-10-21 ENCOUNTER — Ambulatory Visit
Admission: RE | Admit: 2023-10-21 | Discharge: 2023-10-21 | Disposition: A | Discharge status: Home / Self Care | Source: Ambulatory Visit | Attending: Internal Medicine | Admitting: Internal Medicine

## 2023-10-21 DIAGNOSIS — E041 Nontoxic single thyroid nodule: Secondary | ICD-10-CM

## 2024-02-15 ENCOUNTER — Other Ambulatory Visit: Payer: Self-pay | Admitting: Internal Medicine

## 2024-02-15 DIAGNOSIS — Z1231 Encounter for screening mammogram for malignant neoplasm of breast: Secondary | ICD-10-CM

## 2024-03-15 ENCOUNTER — Ambulatory Visit
Admission: RE | Admit: 2024-03-15 | Discharge: 2024-03-15 | Disposition: A | Discharge status: Home / Self Care | Source: Ambulatory Visit | Attending: Internal Medicine

## 2024-03-15 DIAGNOSIS — Z1231 Encounter for screening mammogram for malignant neoplasm of breast: Secondary | ICD-10-CM
